# Patient Record
Sex: Male | Born: 1970
Health system: Southern US, Community
[De-identification: ages and names within clinical notes are randomized; demographics above are authoritative.]

## PROBLEM LIST (undated history)

## (undated) DIAGNOSIS — M109 Gout, unspecified: Secondary | ICD-10-CM

## (undated) DIAGNOSIS — E119 Type 2 diabetes mellitus without complications: Secondary | ICD-10-CM

---

## 1998-10-11 ENCOUNTER — Emergency Department (HOSPITAL_COMMUNITY): Admission: EM | Admit: 1998-10-11 | Discharge: 1998-10-11 | Payer: Self-pay | Admitting: Emergency Medicine

## 1999-12-03 ENCOUNTER — Emergency Department (HOSPITAL_COMMUNITY): Admission: EM | Admit: 1999-12-03 | Discharge: 1999-12-03 | Payer: Self-pay | Admitting: Emergency Medicine

## 1999-12-21 ENCOUNTER — Emergency Department (HOSPITAL_COMMUNITY): Admission: EM | Admit: 1999-12-21 | Discharge: 1999-12-21 | Payer: Self-pay | Admitting: Emergency Medicine

## 2001-12-20 ENCOUNTER — Emergency Department (HOSPITAL_COMMUNITY): Admission: EM | Admit: 2001-12-20 | Discharge: 2001-12-20 | Payer: Self-pay | Admitting: *Deleted

## 2005-01-07 ENCOUNTER — Emergency Department (HOSPITAL_COMMUNITY): Admission: EM | Admit: 2005-01-07 | Discharge: 2005-01-07 | Payer: Self-pay | Admitting: Family Medicine

## 2012-03-01 ENCOUNTER — Encounter (HOSPITAL_COMMUNITY): Payer: Self-pay | Admitting: Emergency Medicine

## 2012-03-01 ENCOUNTER — Emergency Department (HOSPITAL_COMMUNITY)
Admission: EM | Admit: 2012-03-01 | Discharge: 2012-03-01 | Disposition: A | Discharge status: Home / Self Care | Payer: Self-pay | Attending: Emergency Medicine | Admitting: Emergency Medicine

## 2012-03-01 DIAGNOSIS — M65849 Other synovitis and tenosynovitis, unspecified hand: Secondary | ICD-10-CM | POA: Insufficient documentation

## 2012-03-01 DIAGNOSIS — M65839 Other synovitis and tenosynovitis, unspecified forearm: Secondary | ICD-10-CM | POA: Insufficient documentation

## 2012-03-01 DIAGNOSIS — M778 Other enthesopathies, not elsewhere classified: Secondary | ICD-10-CM

## 2012-03-01 DIAGNOSIS — M2550 Pain in unspecified joint: Secondary | ICD-10-CM | POA: Insufficient documentation

## 2012-03-01 DIAGNOSIS — F17200 Nicotine dependence, unspecified, uncomplicated: Secondary | ICD-10-CM | POA: Insufficient documentation

## 2012-03-01 MED ORDER — IBUPROFEN 800 MG PO TABS: 800.0000 mg | ORAL_TABLET | Freq: Three times a day (TID) | ORAL | Status: DC

## 2012-03-01 MED ORDER — IBUPROFEN 400 MG PO TABS
800.0000 mg | ORAL_TABLET | Freq: Once | ORAL | Status: AC
  Administered 2012-03-01: 800 mg via ORAL
  Filled 2012-03-01: qty 2

## 2012-03-01 MED ORDER — HYDROCODONE-ACETAMINOPHEN 5-325 MG PO TABS: ORAL_TABLET | ORAL | Status: DC

## 2012-03-01 NOTE — ED Provider Notes (Signed)
History    This chart was scribed for Dillon Wolfe. Dillon Lamas, MD, MD by Dillon Wolfe, ED Scribe. The patient was seen in room TR08C and the patient's care was started at 8:39AM.   CSN: [id number]  Arrival date & time 03/01/12  0846   None     Chief Complaint  Patient presents with  . Wrist Pain    (Consider location/radiation/quality/duration/timing/severity/associated sxs/prior treatment) The history is provided by the patient. No language interpreter was used.   [name] is a 41 y.o. male who presents to the Emergency Department complaining of constant, moderate right wrist pain onset 2 days ago. 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 Pt reports taking Tylenol and Advil without relief. He reports swelling in right hand. He denies injury or trauma to wrist and any other pain currently.    History reviewed. No pertinent past medical history.  History reviewed. No pertinent past surgical history.  History reviewed. No pertinent family history.  History  Substance Use Topics  . Smoking status: Light Tobacco Smoker  . Smokeless tobacco: Not on file  . Alcohol Use: No      Review of Systems  Gastrointestinal: Negative for nausea and vomiting.  Musculoskeletal: Positive for arthralgias.  Skin: Negative for color change, rash and wound.  Neurological: Negative for weakness and numbness.    Allergies  Review of patient's allergies indicates no known allergies.  Home Medications   Current Outpatient Rx  Name  Route  Sig  Dispense  Refill  . ACETAMINOPHEN 500 MG PO TABS   Oral   Take 1,000 mg by mouth every 6 (six) hours as needed. For pain         . IBUPROFEN 200 MG PO TABS   Oral   Take 400 mg by mouth every 6 (six) hours as needed. For pain         . HYDROCODONE-ACETAMINOPHEN 5-325 MG PO TABS      1-2 tablets po q 6 hours prn moderate to severe pain   20 tablet   0   . IBUPROFEN 800 MG PO TABS   Oral  Take 1 tablet (800 mg total) by mouth 3 (three) times daily with meals.   21 tablet   0     BP 146/91  Pulse 79  Temp 97.7 F (36.5 C) (Oral)  Resp 20  SpO2 97%  Physical Exam  Nursing note and vitals reviewed. Constitutional: He is oriented to person, place, and time. He appears well-developed and well-nourished. No distress.  HENT:  Head: Normocephalic and atraumatic.  Eyes: EOM are normal.  Pulmonary/Chest: Effort normal. No respiratory distress.  Musculoskeletal: Normal range of motion. He exhibits tenderness.       Minimal edema of right wrist  Tenderness to extensor of right wrist to mid forearm Pain worse with right extension of wrist  No deformity Good pulses Good cap refill  Nl sensation of right fingers Neg snuffbox tenderness    Neurological: He is alert and oriented to person, place, and time.  Skin: Skin is warm and dry. No rash noted. No pallor.  Psychiatric: He has a normal mood and affect. His behavior is normal.    ED Course  Procedures (including critical care time) DIAGNOSTIC STUDIES: Oxygen Saturation is 97% on room air, normal by my interpretation.    COORDINATION OF CARE: 9:04 AM Discussed ED treatment with pt  9:23 AM Ordered:     .  ibuprofen  800 mg Oral Once       Labs Reviewed - No data to display No results found.   1. Tendonitis of wrist, right       MDM  I personally performed the services described in this documentation, which was scribed in my presence. The recorded information has been reviewed and is accurate.   No direct trauma.  Swelling, pain, tenderness and history is suggestive of extensor wrist muscle strain, partial tear, or overuse injury.  Conservative treatment for now.  Pt is otherwise healthy.  RICE at home.  No work with right wrist for 1 week . Refer to Sports clinic.     Dillon Wolfe. Dillon Lamas, MD 03/01/12 [phone]

## 2012-03-01 NOTE — Discharge Instructions (Signed)
Narcotic and benzodiazepine use may cause drowsiness, slowed breathing or dependence.  Please use with caution and do not drive, operate machinery or watch young children alone while taking them.  Taking combinations of these medications or drinking alcohol will potentiate these effects.    

## 2012-03-01 NOTE — Progress Notes (Signed)
Orthopedic Tech Progress Note Patient Details:  Dillon Wolfe 06-26-1970 [id number]  Ortho Devices Type of Ortho Device: Velcro wrist splint Ortho Device/Splint Location: RIGHT WRIST SPLINT Ortho Device/Splint Interventions: Application   [name] 03/01/2012, 9:38 AM

## 2012-03-01 NOTE — ED Notes (Signed)
Onset Saturday, right wrist pain. No known injury. Unable to work last pm due to the pain.

## 2013-09-05 ENCOUNTER — Emergency Department (INDEPENDENT_AMBULATORY_CARE_PROVIDER_SITE_OTHER): Payer: Self-pay

## 2013-09-05 ENCOUNTER — Emergency Department (INDEPENDENT_AMBULATORY_CARE_PROVIDER_SITE_OTHER)
Admission: EM | Admit: 2013-09-05 | Discharge: 2013-09-05 | Disposition: A | Discharge status: Home / Self Care | Payer: Self-pay | Source: Home / Self Care | Attending: Emergency Medicine | Admitting: Emergency Medicine

## 2013-09-05 ENCOUNTER — Encounter (HOSPITAL_COMMUNITY): Payer: Self-pay | Admitting: Emergency Medicine

## 2013-09-05 DIAGNOSIS — S93409A Sprain of unspecified ligament of unspecified ankle, initial encounter: Secondary | ICD-10-CM

## 2013-09-05 DIAGNOSIS — Y93H2 Activity, gardening and landscaping: Secondary | ICD-10-CM

## 2013-09-05 DIAGNOSIS — X500XXA Overexertion from strenuous movement or load, initial encounter: Secondary | ICD-10-CM

## 2013-09-05 NOTE — Discharge Instructions (Signed)
Acute Ankle Sprain  with Phase I Rehab  An acute ankle sprain is a partial or complete tear in one or more of the ligaments of the ankle due to traumatic injury. The severity of the injury depends on both the the number of ligaments sprained and the grade of sprain. There are 3 grades of sprains.   · A grade 1 sprain is a mild sprain. There is a slight pull without obvious tearing. There is no loss of strength, and the muscle and ligament are the correct length.  · A grade 2 sprain is a moderate sprain. There is tearing of fibers within the substance of the ligament where it connects two bones or two cartilages. The length of the ligament is increased, and there is usually decreased strength.  · A grade 3 sprain is a complete rupture of the ligament and is uncommon.  In addition to the grade of sprain, there are three types of ankle sprains.   Lateral ankle sprains: This is a sprain of one or more of the three ligaments on the outer side (lateral) of the ankle. These are the most common sprains.  Medial ankle sprains: There is one large triangular ligament of the inner side (medial) of the ankle that is susceptible to injury. Medial ankle sprains are less common.  Syndesmosis, "high ankle," sprains: The syndesmosis is the ligament that connects the two bones of the lower leg. Syndesmosis sprains usually only occur with very severe ankle sprains.  SYMPTOMS  · Pain, tenderness, and swelling in the ankle, starting at the side of injury that may progress to the whole ankle and foot with time.  · "Pop" or tearing sensation at the time of injury.  · Bruising that may spread to the heel.  · Impaired ability to walk soon after injury.  CAUSES   · Acute ankle sprains are caused by trauma placed on the ankle that temporarily forces or pries the anklebone (talus) out of its normal socket.  · Stretching or tearing of the ligaments that normally hold the joint in place (usually due to a twisting injury).  RISK INCREASES  WITH:  · Previous ankle sprain.  · Sports in which the foot may land awkwardly (ie. basketball, volleyball, or soccer) or walking or running on uneven or rough surfaces.  · Shoes with inadequate support to prevent sideways motion when stress occurs.  · Poor strength and flexibility.  · Poor balance skills.  · Contact sports.  PREVENTION   · Warm up and stretch properly before activity.  · Maintain physical fitness:  · Ankle and leg flexibility, muscle strength, and endurance.  · Cardiovascular fitness.  · Balance training activities.  · Use proper technique and have a coach correct improper technique.  · Taping, protective strapping, bracing, or high-top tennis shoes may help prevent injury. Initially, tape is best; however, it loses most of its support function within 10 to 15 minutes.  · Wear proper fitted protective shoes (High-top shoes with taping or bracing is more effective than either alone).  · Provide the ankle with support during sports and practice activities for 12 months following injury.  PROGNOSIS   · If treated properly, ankle sprains can be expected to recover completely; however, the length of recovery depends on the degree of injury.  · A grade 1 sprain usually heals enough in 5 to 7 days to allow modified activity and requires an average of 6 weeks to heal completely.  · A grade 2 sprain requires   6 to 10 weeks to heal completely.  · A grade 3 sprain requires 12 to 16 weeks to heal.  · A syndesmosis sprain often takes more than 3 months to heal.  RELATED COMPLICATIONS   · Frequent recurrence of symptoms may result in a chronic problem. Appropriately addressing the problem the first time decreases the frequency of recurrence and optimizes healing time. Severity of the initial sprain does not predict the likelihood of later instability.  · Injury to other structures (bone, cartilage, or tendon).  · A chronically unstable or arthritic ankle joint is a possiblity with repeated  sprains.  TREATMENT  Treatment initially involves the use of ice, medication, and compression bandages to help reduce pain and inflammation. Ankle sprains are usually immobilized in a walking cast or boot to allow for healing. Crutches may be recommended to reduce pressure on the injury. After immobilization, strengthening and stretching exercises may be necessary to regain strength and a full range of motion. Surgery is rarely needed to treat ankle sprains.  MEDICATION   · Nonsteroidal anti-inflammatory medications, such as aspirin and ibuprofen (do not take for the first 3 days after injury or within 7 days before surgery), or other minor pain relievers, such as acetaminophen, are often recommended. Take these as directed by your caregiver. Contact your caregiver immediately if any bleeding, stomach upset, or signs of an allergic reaction occur from these medications.  · Ointments applied to the skin may be helpful.  · Pain relievers may be prescribed as necessary by your caregiver. Do not take prescription pain medication for longer than 4 to 7 days. Use only as directed and only as much as you need.  HEAT AND COLD  · Cold treatment (icing) is used to relieve pain and reduce inflammation for acute and chronic cases. Cold should be applied for 10 to 15 minutes every 2 to 3 hours for inflammation and pain and immediately after any activity that aggravates your symptoms. Use ice packs or an ice massage.  · Heat treatment may be used before performing stretching and strengthening activities prescribed by your caregiver. Use a heat pack or a warm soak.  SEEK IMMEDIATE MEDICAL CARE IF:   · Pain, swelling, or bruising worsens despite treatment.  · You experience pain, numbness, discoloration, or coldness in the foot or toes.  · New, unexplained symptoms develop (drugs used in treatment may produce side effects.)  EXERCISES   PHASE I EXERCISES  RANGE OF MOTION (ROM) AND STRETCHING EXERCISES - Ankle Sprain, Acute Phase I,  Weeks 1 to 2  These exercises may help you when beginning to restore flexibility in your ankle. You will likely work on these exercises for the 1 to 2 weeks after your injury. Once your physician, physical therapist, or athletic trainer sees adequate progress, he or she will advance your exercises. While completing these exercises, remember:   · Restoring tissue flexibility helps normal motion to return to the joints. This allows healthier, less painful movement and activity.  · An effective stretch should be held for at least 30 seconds.  · A stretch should never be painful. You should only feel a gentle lengthening or release in the stretched tissue.  RANGE OF MOTION - Dorsi/Plantar Flexion  · While sitting with your right / left knee straight, draw the top of your foot upwards by flexing your ankle. Then reverse the motion, pointing your toes downward.  · Hold each position for __________ seconds.  · After completing your first set of   exercises, repeat this exercise with your knee bent.  Repeat __________ times. Complete this exercise __________ times per day.   RANGE OF MOTION - Ankle Alphabet  · Imagine your right / left big toe is a pen.  · Keeping your hip and knee still, write out the entire alphabet with your "pen." Make the letters as large as you can without increasing any discomfort.  Repeat __________ times. Complete this exercise __________ times per day.   STRENGTHENING EXERCISES - Ankle Sprain, Acute -Phase I, Weeks 1 to 2  These exercises may help you when beginning to restore strength in your ankle. You will likely work on these exercises for 1 to 2 weeks after your injury. Once your physician, physical therapist, or athletic trainer sees adequate progress, he or she will advance your exercises. While completing these exercises, remember:   · Muscles can gain both the endurance and the strength needed for everyday activities through controlled exercises.  · Complete these exercises as instructed by  your physician, physical therapist, or athletic trainer. Progress the resistance and repetitions only as guided.  · You may experience muscle soreness or fatigue, but the pain or discomfort you are trying to eliminate should never worsen during these exercises. If this pain does worsen, stop and make certain you are following the directions exactly. If the pain is still present after adjustments, discontinue the exercise until you can discuss the trouble with your clinician.  STRENGTH - Dorsiflexors  · Secure a rubber exercise band/tubing to a fixed object (ie. table, pole) and loop the other end around your right / left foot.  · Sit on the floor facing the fixed object. The band/tubing should be slightly tense when your foot is relaxed.  · Slowly draw your foot back toward you using your ankle and toes.  · Hold this position for __________ seconds. Slowly release the tension in the band and return your foot to the starting position.  Repeat __________ times. Complete this exercise __________ times per day.   STRENGTH - Plantar-flexors   · Sit with your right / left leg extended. Holding onto both ends of a rubber exercise band/tubing, loop it around the ball of your foot. Keep a slight tension in the band.  · Slowly push your toes away from you, pointing them downward.  · Hold this position for __________ seconds. Return slowly, controlling the tension in the band/tubing.  Repeat __________ times. Complete this exercise __________ times per day.   STRENGTH - Ankle Eversion  · Secure one end of a rubber exercise band/tubing to a fixed object (table, pole). Loop the other end around your foot just before your toes.  · Place your fists between your knees. This will focus your strengthening at your ankle.  · Drawing the band/tubing across your opposite foot, slowly, pull your little toe out and up. Make sure the band/tubing is positioned to resist the entire motion.  · Hold this position for __________ seconds.  Have  your muscles resist the band/tubing as it slowly pulls your foot back to the starting position.   Repeat __________ times. Complete this exercise __________ times per day.   STRENGTH - Ankle Inversion  · Secure one end of a rubber exercise band/tubing to a fixed object (table, pole). Loop the other end around your foot just before your toes.  · Place your fists between your knees. This will focus your strengthening at your ankle.  · Slowly, pull your big toe up and in, making   sure the band/tubing is positioned to resist the entire motion.  · Hold this position for __________ seconds.  · Have your muscles resist the band/tubing as it slowly pulls your foot back to the starting position.  Repeat __________ times. Complete this exercises __________ times per day.   STRENGTH - Towel Curls  · Sit in a chair positioned on a non-carpeted surface.  · Place your right / left foot on a towel, keeping your heel on the floor.  · Pull the towel toward your heel by only curling your toes. Keep your heel on the floor.  · If instructed by your physician, physical therapist, or athletic trainer, add weight to the end of the towel.  Repeat __________ times. Complete this exercise __________ times per day.  Document Released: 10/23/2004 Document Revised: 06/16/2011 Document Reviewed: 07/06/2008  [employer]® Patient Information ©2014 [employer].

## 2013-09-05 NOTE — ED Provider Notes (Signed)
CSN: [id number]     Arrival date & time 09/05/13  1108 History   First MD Initiated Contact with Patient 09/05/13 1248     Chief Complaint  Patient presents with  . Ankle Pain   (Consider location/radiation/quality/duration/timing/severity/associated sxs/prior Treatment) HPI Comments: 43 year old male presents complaining of ankle pain. He was mowing his grass when she stepped in a hole and rolled his ankle over. This happened Saturday, 2 days ago. The pain is not that bad when he is sitting still, but with weightbearing it is much more severe. It radiates up his leg and down for his foot, and also over to his medial ankle from his lateral ankle. He has been weightbearing but with difficulty. He denies any other injuries. No numbness in the foot, he does have some swelling in the ankle.   History reviewed. No pertinent past medical history. History reviewed. No pertinent past surgical history. No family history on file. History  Substance Use Topics  . Smoking status: Light Tobacco Smoker  . Smokeless tobacco: Not on file  . Alcohol Use: No    Review of Systems  Musculoskeletal:       See history of present illness  All other systems reviewed and are negative.   Allergies  Review of patient's allergies indicates no known allergies.  Home Medications   Prior to Admission medications   Medication Sig Start Date End Date Taking? Authorizing Provider  acetaminophen (TYLENOL) 500 MG tablet Take 1,000 mg by mouth every 6 (six) hours as needed. For pain    Historical Provider, MD  HYDROcodone-acetaminophen (NORCO/VICODIN) 5-325 MG per tablet 1-2 tablets po q 6 hours prn moderate to severe pain 03/01/12   Gavin PoundMichael Y. Ghim, MD  ibuprofen (ADVIL,MOTRIN) 200 MG tablet Take 400 mg by mouth every 6 (six) hours as needed. For pain    Historical Provider, MD  ibuprofen (ADVIL,MOTRIN) 800 MG tablet Take 1 tablet (800 mg total) by mouth 3 (three) times daily with meals. 03/01/12   Gavin PoundMichael Y. Ghim,  MD   BP 136/83  Pulse 76  Temp(Src) 98.3 F (36.8 C) (Oral)  Resp 12  SpO2 96% Physical Exam  Nursing note and vitals reviewed. Constitutional: He is oriented to person, place, and time. He appears well-developed and well-nourished. No distress.  HENT:  Head: Normocephalic.  Cardiovascular:  Pulses:      Dorsalis pedis pulses are 2+ on the right side, and 2+ on the left side.  Pulmonary/Chest: Effort normal. No respiratory distress.  Musculoskeletal:       Right ankle: He exhibits decreased range of motion (Mild, diffuse) and swelling (Lateral ankle). He exhibits no ecchymosis and normal pulse. Tenderness. Lateral malleolus (minimal.) tenderness found. No AITFL, no CF ligament, no posterior TFL and no head of 5th metatarsal tenderness found. Achilles tendon normal.       Right foot: Normal.  Neurological: He is alert and oriented to person, place, and time. Coordination normal.  Skin: Skin is warm and dry. No rash noted. He is not diaphoretic.  Psychiatric: He has a normal mood and affect. Judgment normal.    ED Course  Procedures (including critical care time) Labs Review Labs Reviewed - No data to display  Imaging Review Dg Ankle Complete Right  09/05/2013   CLINICAL DATA:  Pain post trauma  EXAM: RIGHT ANKLE - COMPLETE 3+ VIEW  COMPARISON:  None.  FINDINGS: Frontal, oblique, and lateral views were obtained. There is soft tissue swelling laterally. No fracture or effusion. Ankle mortise appears  intact. There is a small spur arising from the inferior calcaneus.  IMPRESSION: Soft tissue swelling laterally. Small calcaneal spur inferiorly. No fracture. Mortise intact.   Electronically Signed   By: Bretta Bang M.D.   On: 09/05/2013 13:20     MDM   1. Ankle sprain    X-ray is normal. He has an air splint at home, and will start using that. He has ibuprofen, he'll take that for pain. Elevate and ice. Activity as tolerated. Followup with primary care week if not  improving.      Graylon Good, PA-C 09/06/13 1203  Graylon Good, PA-C 09/06/13 [phone]

## 2013-09-05 NOTE — ED Notes (Signed)
Pt c/o right ankle pain onset Saturday Reports he twisted his ankle while mowing yard Sx include intermittent swelling and pain when bearing wt Alert w/no signs of acute distress; assisted by crutches

## 2013-09-06 NOTE — ED Provider Notes (Signed)
Medical screening examination/treatment/procedure(s) were performed by non-physician practitioner and as supervising physician I was immediately available for consultation/collaboration.  Leslee Home, M.D.  Reuben Likes, MD 09/06/13 [phone]

## 2014-07-02 ENCOUNTER — Emergency Department (HOSPITAL_COMMUNITY)
Admission: EM | Admit: 2014-07-02 | Discharge: 2014-07-02 | Disposition: A | Discharge status: Home / Self Care | Payer: Self-pay | Attending: Emergency Medicine | Admitting: Emergency Medicine

## 2014-07-02 ENCOUNTER — Encounter (HOSPITAL_COMMUNITY): Payer: Self-pay | Admitting: *Deleted

## 2014-07-02 DIAGNOSIS — Z720 Tobacco use: Secondary | ICD-10-CM | POA: Insufficient documentation

## 2014-07-02 DIAGNOSIS — F10121 Alcohol abuse with intoxication delirium: Secondary | ICD-10-CM | POA: Insufficient documentation

## 2014-07-02 DIAGNOSIS — Z79899 Other long term (current) drug therapy: Secondary | ICD-10-CM | POA: Insufficient documentation

## 2014-07-02 DIAGNOSIS — F10921 Alcohol use, unspecified with intoxication delirium: Secondary | ICD-10-CM

## 2014-07-02 LAB — ETHANOL: Alcohol, Ethyl (B): 307 mg/dL — ABNORMAL HIGH (ref 0–9)

## 2014-07-02 LAB — RAPID URINE DRUG SCREEN, HOSP PERFORMED
Amphetamines: NOT DETECTED
Barbiturates: NOT DETECTED
Benzodiazepines: NOT DETECTED
Cocaine: NOT DETECTED
Opiates: NOT DETECTED
Tetrahydrocannabinol: NOT DETECTED

## 2014-07-02 MED ORDER — ONDANSETRON HCL 4 MG/2ML IJ SOLN
4.0000 mg | Freq: Once | INTRAMUSCULAR | Status: DC
  Filled 2014-07-02: qty 2

## 2014-07-02 MED ORDER — PANTOPRAZOLE SODIUM 40 MG IV SOLR
40.0000 mg | Freq: Once | INTRAVENOUS | Status: AC
  Administered 2014-07-02: 40 mg via INTRAVENOUS
  Filled 2014-07-02: qty 40

## 2014-07-02 MED ORDER — ONDANSETRON HCL 4 MG/2ML IJ SOLN
4.0000 mg | Freq: Once | INTRAMUSCULAR | Status: AC
  Administered 2014-07-02: 4 mg via INTRAVENOUS

## 2014-07-02 MED ORDER — SODIUM CHLORIDE 0.9 % IV BOLUS (SEPSIS)
1000.0000 mL | Freq: Once | INTRAVENOUS | Status: AC
  Administered 2014-07-02: 1000 mL via INTRAVENOUS

## 2014-07-02 NOTE — ED Notes (Signed)
Bed: RESB Expected date:  Expected time:  Means of arrival:  Comments: EMS-OD 

## 2014-07-02 NOTE — ED Notes (Signed)
Pt refused vitals upon d/c. Pt was provided with a bus pass to get home. Pt A&O x4 and ambulates with steady gait.

## 2014-07-02 NOTE — ED Notes (Signed)
Breakfast tray offered.

## 2014-07-02 NOTE — ED Notes (Addendum)
Pt responds when this RN gently shook shoulder. PT reports he is at the hospital when asked if he was aware of where he was. PT goes back to sleep after answering questions. PT is lying on stomach comfortably. SPO2 92% hear rate 86. No vomitus noted to mouth. Non slip socks placed on patient's feet.

## 2014-07-02 NOTE — ED Notes (Signed)
Patient resting in position of comfort with eyes closed RR WNL--even and unlabored with equal rise and fall of chest Patient in NAD Side rails up, call bell in reach  

## 2014-07-02 NOTE — ED Notes (Signed)
Pt was awakened by [employer] and told to eat. Pt raised to yell stating " I don;t like to be rushed. I'm paying for this." Pt was advised by [employer] that the bed was needed for sick pts and he needs to get up. Pt was also advised that he has been allowed to sleep in hall bed for close to 12 hours. Pt now lying back on bed

## 2014-07-02 NOTE — ED Provider Notes (Signed)
CSN: [id number]     Arrival date & time 07/02/14  0146 History   First MD Initiated Contact with Patient 07/02/14 0159     Chief Complaint  Patient presents with  . Alcohol Intoxication     (Consider location/radiation/quality/duration/timing/severity/associated sxs/prior Treatment) HPI  Level 5 Caveat: altered mental status, intoxicated. This is a 44 year old male who was "drinking all day" with some friends or family in a garage. He was sleeping in a chair in the corner but when he began vomiting on himself EMS was called. He was transiently arousable to noxious stimuli but is otherwise been unresponsive. He was covered with emesis. EMS reports both beer and hard liquor were present. Bystanders report he has also been using marijuana.  History reviewed. No pertinent past medical history. History reviewed. No pertinent past surgical history. History reviewed. No pertinent family history. History  Substance Use Topics  . Smoking status: Light Tobacco Smoker  . Smokeless tobacco: Not on file  . Alcohol Use: Yes    Review of Systems  Unable to perform ROS   Allergies  Review of patient's allergies indicates no known allergies.  Home Medications   Prior to Admission medications   Medication Sig Start Date End Date Taking? Authorizing Provider  acetaminophen (TYLENOL) 500 MG tablet Take 1,000 mg by mouth every 6 (six) hours as needed. For pain    Historical Provider, MD  HYDROcodone-acetaminophen (NORCO/VICODIN) 5-325 MG per tablet 1-2 tablets po q 6 hours prn moderate to severe pain 03/01/12   Quita SkyeMichael Ghim, MD  ibuprofen (ADVIL,MOTRIN) 200 MG tablet Take 400 mg by mouth every 6 (six) hours as needed. For pain    Historical Provider, MD  ibuprofen (ADVIL,MOTRIN) 800 MG tablet Take 1 tablet (800 mg total) by mouth 3 (three) times daily with meals. 03/01/12   Quita SkyeMichael Ghim, MD   BP 144/76 mmHg  Pulse 95  Resp 21  SpO2 98%   Physical Exam  General: Well-developed, well-nourished  male in no acute distress; appearance consistent with age of record HENT: normocephalic; atraumatic Eyes: Pupils equal, round and pinpoint; bilateral conjunctival injection Neck: supple Heart: regular rate and rhythm Lungs: clear to auscultation bilaterally Abdomen: soft; nondistended; no masses or hepatosplenomegaly; bowel sounds present GU: Tanner 5 male, circumcised Extremities: No deformity; full range of motion; pulses normal Neurologic: Obtunded, with localized pain; noted to move all extremities  Skin: Warm and dry   ED Course  Procedures (including critical care time)   MDM  Nursing notes and vitals signs, including pulse oximetry, reviewed.  Summary of this visit's results, reviewed by myself:  Labs:  Results for orders placed or performed during the hospital encounter of 07/02/14 (from the past 24 hour(s))  Drug screen panel, emergency     Status: None   Collection Time: 07/02/14  2:08 AM  Result Value Ref Range   Opiates NONE DETECTED NONE DETECTED   Cocaine NONE DETECTED NONE DETECTED   Benzodiazepines NONE DETECTED NONE DETECTED   Amphetamines NONE DETECTED NONE DETECTED   Tetrahydrocannabinol NONE DETECTED NONE DETECTED   Barbiturates NONE DETECTED NONE DETECTED  Ethanol     Status: Abnormal   Collection Time: 07/02/14  2:58 AM  Result Value Ref Range   Alcohol, Ethyl (B) 307 (H) 0 - 9 mg/dL   7:826:54 AM Patient now arousable. Will discharge when able to ambulate on his own and has a sober ride available.  Paula LibraJohn Tationna Fullard, MD 07/02/14 [phone]

## 2014-07-02 NOTE — ED Notes (Signed)
Patient brought in by EMS due to ETOH intoxication Patient has been drinking and smoking marijuana all day Per EMS, vomit all over patient and beside patient when arriving on scene Patient arouses to pain only--snoring noted

## 2014-07-02 NOTE — ED Notes (Signed)
Pt provided with sandwich and soda per Dr Loretha StaplerWofford. Pt still wants to sleep and has to be encouraged several times to wake up and eat.

## 2014-07-02 NOTE — ED Provider Notes (Signed)
Care assumed from Dr. Read DriversMolpus.  Pt sober now.  He ambulated without assistance or difficulty.  Discharged.  Clinical Impression: 1. Alcohol intoxication, with delirium       Dillon DivineJohn Zinnia Tindall, MD 07/02/14 1251

## 2014-07-02 NOTE — Discharge Instructions (Signed)
Alcohol Intoxication  Alcohol intoxication occurs when the amount of alcohol that a person has consumed impairs his or her ability to mentally and physically function. Alcohol directly impairs the normal chemical activity of the brain. Drinking large amounts of alcohol can lead to changes in mental function and behavior, and it can cause many physical effects that can be harmful.   Alcohol intoxication can range in severity from mild to very severe. Various factors can affect the level of intoxication that occurs, such as the person's age, gender, weight, frequency of alcohol consumption, and the presence of other medical conditions (such as diabetes, seizures, or heart conditions). Dangerous levels of alcohol intoxication may occur when people drink large amounts of alcohol in a short period (binge drinking). Alcohol can also be especially dangerous when combined with certain prescription medicines or "recreational" drugs.  SIGNS AND SYMPTOMS  Some common signs and symptoms of mild alcohol intoxication include:  · Loss of coordination.  · Changes in mood and behavior.  · Impaired judgment.  · Slurred speech.  As alcohol intoxication progresses to more severe levels, other signs and symptoms will appear. These may include:  · Vomiting.  · Confusion and impaired memory.  · Slowed breathing.  · Seizures.  · Loss of consciousness.  DIAGNOSIS   Your health care provider will take a medical history and perform a physical exam. You will be asked about the amount and type of alcohol you have consumed. Blood tests will be done to measure the concentration of alcohol in your blood. In many places, your blood alcohol level must be lower than 80 mg/dL (0.08%) to legally drive. However, many dangerous effects of alcohol can occur at much lower levels.   TREATMENT   People with alcohol intoxication often do not require treatment. Most of the effects of alcohol intoxication are temporary, and they go away as the alcohol naturally  leaves the body. Your health care provider will monitor your condition until you are stable enough to go home. Fluids are sometimes given through an IV access tube to help prevent dehydration.   HOME CARE INSTRUCTIONS  · Do not drive after drinking alcohol.  · Stay hydrated. Drink enough water and fluids to keep your urine clear or pale yellow. Avoid caffeine.    · Only take over-the-counter or prescription medicines as directed by your health care provider.    SEEK MEDICAL CARE IF:   · You have persistent vomiting.    · You do not feel better after a few days.  · You have frequent alcohol intoxication. Your health care provider can help determine if you should see a substance use treatment counselor.  SEEK IMMEDIATE MEDICAL CARE IF:   · You become shaky or tremble when you try to stop drinking.    · You shake uncontrollably (seizure).    · You throw up (vomit) blood. This may be bright red or may look like black coffee grounds.    · You have blood in your stool. This may be bright red or may appear as a black, tarry, bad smelling stool.    · You become lightheaded or faint.    MAKE SURE YOU:   · Understand these instructions.  · Will watch your condition.  · Will get help right away if you are not doing well or get worse.  Document Released: 01/01/2005 Document Revised: 11/24/2012 Document Reviewed: 08/27/2012  [employer]® Patient Information ©2015 [employer]. This information is not intended to replace advice given to you by your health care provider. Make sure   you discuss any questions you have with your health care provider.

## 2014-07-02 NOTE — ED Notes (Signed)
Pt ambulated in hallway with moderate assistance.

## 2016-01-09 ENCOUNTER — Encounter (HOSPITAL_COMMUNITY): Payer: Self-pay | Admitting: Emergency Medicine

## 2016-01-09 ENCOUNTER — Ambulatory Visit (HOSPITAL_COMMUNITY)
Admission: EM | Admit: 2016-01-09 | Discharge: 2016-01-09 | Disposition: A | Discharge status: Home / Self Care | Payer: Self-pay | Attending: Family Medicine | Admitting: Family Medicine

## 2016-01-09 DIAGNOSIS — R220 Localized swelling, mass and lump, head: Secondary | ICD-10-CM

## 2016-01-09 DIAGNOSIS — T7840XA Allergy, unspecified, initial encounter: Secondary | ICD-10-CM

## 2016-01-09 DIAGNOSIS — T783XXA Angioneurotic edema, initial encounter: Secondary | ICD-10-CM

## 2016-01-09 MED ORDER — EPINEPHRINE HCL 1 MG/ML IJ SOLN
INTRAMUSCULAR | Status: AC
  Filled 2016-01-09: qty 1

## 2016-01-09 MED ORDER — DIPHENHYDRAMINE HCL 25 MG PO CAPS: 50.0000 mg | ORAL_CAPSULE | Freq: Once | ORAL | Status: DC

## 2016-01-09 MED ORDER — DIPHENHYDRAMINE HCL 25 MG PO CAPS
ORAL_CAPSULE | ORAL | Status: AC
  Filled 2016-01-09: qty 2

## 2016-01-09 MED ORDER — PREDNISONE 20 MG PO TABS: 20.0000 mg | ORAL_TABLET | Freq: Every day | ORAL | 0 refills | Status: DC

## 2016-01-09 MED ORDER — EPINEPHRINE HCL 1 MG/ML IJ SOLN
0.3000 mg | Freq: Once | INTRAMUSCULAR | Status: AC
  Administered 2016-01-09: 0.3 mg via INTRAMUSCULAR

## 2016-01-09 NOTE — ED Notes (Signed)
Provider stated to hold the oral benadryl.

## 2016-01-09 NOTE — ED Triage Notes (Signed)
[facility] with a complaint of a rash on his arms, neck and back and his upper lip swelling. The patient stated that it started at his employment.

## 2016-01-09 NOTE — ED Provider Notes (Signed)
CSN: [id number]     Arrival date & time 01/09/16  1438 History   None    Chief Complaint  Patient presents with  . Rash  . Oral Swelling   (Consider location/radiation/quality/duration/timing/severity/associated sxs/prior Treatment) Dillon Wolfe is a 45 y.o male, with no medical history and not currently taking any medication, presents today complaining of swollen lips and rash to his upper back and arms. Rash started 2 days ago. The swollen lip started at 2pm today upon wakening from sleep. 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 The second day he also worked and that is when he noticed the rash on his arms and back. He reports the rash comes and goes. He reports the rash to be very itchy but he didn't think much about it on the second day. He went to work last night as well, came home from work this morning and went to bed feeling fine, work up at 2pm this afternoon and noticed both of his lips are swollen and immediately proceeded to take 50 mg of benadryl. He denies shortness of breath, swollen tongue, wheezing or sensation of his throat closing up.  He denies being allergic to anything. He is not taking any medication. He denies eating any new food. He cannot recall any new exposure or contact.       History reviewed. No pertinent past medical history. History reviewed. No pertinent surgical history. History reviewed. No pertinent family history. Social History  Substance Use Topics  . Smoking status: Light Tobacco Smoker  . Smokeless tobacco: Never Used  . Alcohol use Yes    Review of Systems  Constitutional: Negative for chills, fatigue and fever.  Respiratory: Negative for cough, chest tightness, shortness of breath and wheezing.   Cardiovascular: Negative for chest pain and palpitations.  Gastrointestinal: Negative for  abdominal pain, nausea and vomiting.  Skin: Positive for rash.    Allergies  Review of patient's allergies indicates no known allergies.  Home Medications   Prior to Admission medications   Medication Sig Start Date End Date Taking? Authorizing Provider  acetaminophen (TYLENOL) 500 MG tablet Take 1,000 mg by mouth every 6 (six) hours as needed. For pain    Historical Provider, MD  HYDROcodone-acetaminophen (NORCO/VICODIN) 5-325 MG per tablet 1-2 tablets po q 6 hours prn moderate to severe pain 03/01/12   Quita SkyeMichael Ghim, MD  ibuprofen (ADVIL,MOTRIN) 200 MG tablet Take 400 mg by mouth every 6 (six) hours as needed. For pain    Historical Provider, MD  ibuprofen (ADVIL,MOTRIN) 800 MG tablet Take 1 tablet (800 mg total) by mouth 3 (three) times daily with meals. 03/01/12   Quita SkyeMichael Ghim, MD  predniSONE (DELTASONE) 20 MG tablet Take 1 tablet (20 mg total) by mouth daily with breakfast. Take 3 tablets at one time daily from day 1-3, Take 2 tablets daily from day 4-6, take 1 tablet daily from day 7-9 01/09/16   Lucia EstelleFeng Kinslie Hove, NP   Meds Ordered and Administered this Visit   Medications  EPINEPHrine (ADRENALIN) injection 0.3 mg (0.3 mg Intramuscular Given 01/09/16 1555)    BP 100/69 (BP Location: Left Arm)   Pulse 99   Temp 98.6 F (37 C) (Oral)   Resp 12   SpO2 100%  No data found.   Physical Exam  Constitutional: He appears well-developed and well-nourished.  HENT:  Head: Normocephalic and atraumatic.  Both lips are swollen, no tongue swelling noted.   Cardiovascular: Normal rate, regular rhythm and normal heart sounds.   Pulmonary/Chest: Effort normal and breath sounds normal. No respiratory distress. He has no wheezes.  -Stridor  Abdominal: Soft. Bowel sounds are normal. He exhibits no distension. There is no tenderness.  Skin:  Hives noted on right and left upper back. No rash noted on arms.   Nursing note and vitals reviewed.   Urgent Care Course   Clinical Course     Procedures (including critical care time)  Labs Review Labs Reviewed - No data to display  Imaging Review No results found.   MDM   1. Giant hives, initial encounter   2. Swelling of both lips    Patient informed that he most likely got allergic to something causing him to have these symptoms. Patient is in denial; refusing to believe that his swollen lips and rash is due to an allergy. Epi 0.3mg  given IM today. Patient kept for an hour in the UC with no worsening in symptoms noted. Rx for prednisone given for the rash. Respiratory system intact, patient safe to be discharge home. ER return precaution discussed thoroughly and patient states understanding. Also recommended to see an allergist.     Lucia Estelle, NP 01/09/16 1806

## 2016-10-07 ENCOUNTER — Ambulatory Visit (INDEPENDENT_AMBULATORY_CARE_PROVIDER_SITE_OTHER): Payer: BLUE CROSS/BLUE SHIELD

## 2016-10-07 ENCOUNTER — Encounter (HOSPITAL_COMMUNITY): Payer: Self-pay | Admitting: Family Medicine

## 2016-10-07 ENCOUNTER — Ambulatory Visit (HOSPITAL_COMMUNITY)
Admission: EM | Admit: 2016-10-07 | Discharge: 2016-10-07 | Disposition: A | Discharge status: Home / Self Care | Payer: BLUE CROSS/BLUE SHIELD | Attending: Internal Medicine | Admitting: Internal Medicine

## 2016-10-07 DIAGNOSIS — S93401A Sprain of unspecified ligament of right ankle, initial encounter: Secondary | ICD-10-CM

## 2016-10-07 MED ORDER — IBUPROFEN 600 MG PO TABS: 600.0000 mg | ORAL_TABLET | Freq: Four times a day (QID) | ORAL | 0 refills | Status: DC | PRN

## 2016-10-07 NOTE — Discharge Instructions (Signed)
Wear the ankle splint for the next 4-5 days. Weightbearing as tolerated. For the next 2 or 3 days primarily elevate the ankle but ice on it rest it,. No exercises on the ankle for at least 2 weeks. It may feel better before then but it needs that time to strengthen completely up before running, jumping or doing exercises.

## 2016-10-07 NOTE — ED Provider Notes (Signed)
CSN: [id number]     Arrival date & time 10/07/16  1005 History   First MD Initiated Contact with Patient 10/07/16 1052     Chief Complaint  Patient presents with  . Ankle Pain   (Consider location/radiation/quality/duration/timing/severity/associated sxs/prior Treatment) 46 year old male states that he was performing step aerobics yesterday where he would have to step up and then down off of a block. Somehow he missed the step and fell twisting his right ankle. Is complaining of pain that developed immediately and has persisted through today. Pain is primarily around the ankle with some pain to the plantar aspect of the heel.      History reviewed. No pertinent past medical history. History reviewed. No pertinent surgical history. History reviewed. No pertinent family history. Social History  Substance Use Topics  . Smoking status: Light Tobacco Smoker  . Smokeless tobacco: Never Used  . Alcohol use Yes    Review of Systems  Constitutional: Positive for activity change.  Respiratory: Negative.   Gastrointestinal: Negative.   Genitourinary: Negative.   Musculoskeletal: Negative for back pain and myalgias.       As per HPI  Skin: Negative.   Neurological: Negative for dizziness, weakness, numbness and headaches.  All other systems reviewed and are negative.   Allergies  Patient has no known allergies.  Home Medications   Prior to Admission medications   Medication Sig Start Date End Date Taking? Authorizing Provider  acetaminophen (TYLENOL) 500 MG tablet Take 1,000 mg by mouth every 6 (six) hours as needed. For pain    [provider]  HYDROcodone-acetaminophen (NORCO/VICODIN) 5-325 MG per tablet 1-2 tablets po q 6 hours prn moderate to severe pain 03/01/12   Quita Skye, MD  ibuprofen (ADVIL,MOTRIN) 600 MG tablet Take 1 tablet (600 mg total) by mouth every 6 (six) hours as needed. Take with food 10/07/16   Hayden Rasmussen, NP  predniSONE (DELTASONE) 20 MG tablet Take  1 tablet (20 mg total) by mouth daily with breakfast. Take 3 tablets at one time daily from day 1-3, Take 2 tablets daily from day 4-6, take 1 tablet daily from day 7-9 01/09/16   Lucia Estelle, NP   Meds Ordered and Administered this Visit  Medications - No data to display  BP (!) 138/94   Pulse 78   Temp 98.1 F (36.7 C)   Resp 18   SpO2 99%  No data found.   Physical Exam  Constitutional: He is oriented to person, place, and time. He appears well-developed and well-nourished. No distress.  HENT:  Head: Normocephalic and atraumatic.  Eyes: EOM are normal. Left eye exhibits no discharge.  Neck: Normal range of motion. Neck supple.  Cardiovascular: Normal rate.   Pulmonary/Chest: Effort normal.  Musculoskeletal: He exhibits tenderness. He exhibits no deformity.  Minor swelling to the right ankle. Tenderness surrounding the ankle and to the plantar aspect near the arch and heel. No deformity. Pedal pulse 2+. Able to perform limited range of motion secondary to pain. No bony tenderness appreciated. Neurovascular motor sensory grossly intact.  Neurological: He is alert and oriented to person, place, and time. No cranial nerve deficit.  Skin: Skin is warm and dry.  Psychiatric: He has a normal mood and affect.  Nursing note and vitals reviewed.   Urgent Care Course     Procedures (including critical care time)  Labs Review Labs Reviewed - No data to display  Imaging Review Dg Ankle Complete Right  Result Date: 10/07/2016 CLINICAL DATA:  Injured  ankle doing aerobics yesterday EXAM: RIGHT ANKLE - COMPLETE 3+ VIEW COMPARISON:  Right ankle films of 09/05/2013 FINDINGS: There are small bony densities just anterior to tibiotalar articulation on lateral view which do not appear to be acute and may represent old avulsion fragments. There is mild degenerative change as well with some spurring from the medial malleolus. The ankle joint is unremarkable and alignment is normal. A tiny plantar  calcaneal degenerative spur is present. IMPRESSION: 1. No definite acute fracture. 2. Small bony fragments anterior to the tibiotalar articulation most consistent with old small avulsion fracture fragments. 3. Mild degenerative change. Electronically Signed   By: Dwyane DeePaul  Barry M.D.   On: 10/07/2016 11:19     Visual Acuity Review  Right Eye Distance:   Left Eye Distance:   Bilateral Distance:    Right Eye Near:   Left Eye Near:    Bilateral Near:         MDM   1. Sprain of right ankle, unspecified ligament, initial encounter    Wear the ankle splint for the next 4-5 days. Weightbearing as tolerated. For the next 2 or 3 days primarily elevate the ankle but ice on it rest it,. No exercises on the ankle for at least 2 weeks. It may feel better before then but it needs that time to strengthen completely up before running, jumping or doing exercises.     Hayden RasmussenMabe, Michelle Wnek, NP 10/07/16 1146

## 2016-10-07 NOTE — ED Triage Notes (Signed)
Pt here for right foot and ankle pain. sts that he injured it at the gym a few days ago. sts he was doing some sort of step class and stepped up and then fell

## 2016-10-11 NOTE — ED Notes (Signed)
Pt   Returned   Charity fundraiserWanting  Clarification    Of   Work  Note    Chart  Given to  Tenet HealthcareDavid  mabe   Who  Spoke  To  The  Pt

## 2016-10-15 ENCOUNTER — Ambulatory Visit (HOSPITAL_COMMUNITY)
Admission: EM | Admit: 2016-10-15 | Discharge: 2016-10-15 | Disposition: A | Discharge status: Home / Self Care | Payer: BLUE CROSS/BLUE SHIELD

## 2016-10-15 DIAGNOSIS — Z5321 Procedure and treatment not carried out due to patient leaving prior to being seen by health care provider: Secondary | ICD-10-CM

## 2017-09-24 ENCOUNTER — Encounter (HOSPITAL_COMMUNITY): Payer: Self-pay | Admitting: *Deleted

## 2017-09-24 ENCOUNTER — Emergency Department (HOSPITAL_COMMUNITY)
Admission: EM | Admit: 2017-09-24 | Discharge: 2017-09-24 | Disposition: A | Discharge status: Home / Self Care | Payer: BLUE CROSS/BLUE SHIELD | Attending: Emergency Medicine | Admitting: Emergency Medicine

## 2017-09-24 ENCOUNTER — Other Ambulatory Visit: Payer: Self-pay

## 2017-09-24 DIAGNOSIS — S66911A Strain of unspecified muscle, fascia and tendon at wrist and hand level, right hand, initial encounter: Secondary | ICD-10-CM | POA: Diagnosis not present

## 2017-09-24 DIAGNOSIS — Y929 Unspecified place or not applicable: Secondary | ICD-10-CM | POA: Insufficient documentation

## 2017-09-24 DIAGNOSIS — Z79899 Other long term (current) drug therapy: Secondary | ICD-10-CM | POA: Diagnosis not present

## 2017-09-24 DIAGNOSIS — Y999 Unspecified external cause status: Secondary | ICD-10-CM | POA: Diagnosis not present

## 2017-09-24 DIAGNOSIS — Y939 Activity, unspecified: Secondary | ICD-10-CM | POA: Diagnosis not present

## 2017-09-24 DIAGNOSIS — F17210 Nicotine dependence, cigarettes, uncomplicated: Secondary | ICD-10-CM | POA: Diagnosis not present

## 2017-09-24 DIAGNOSIS — X58XXXA Exposure to other specified factors, initial encounter: Secondary | ICD-10-CM | POA: Insufficient documentation

## 2017-09-24 MED ORDER — IBUPROFEN 800 MG PO TABS: 800.0000 mg | ORAL_TABLET | Freq: Three times a day (TID) | ORAL | 0 refills | Status: DC

## 2017-09-24 NOTE — Discharge Instructions (Signed)
Take 4 over the counter ibuprofen tablets 3 times a day or 2 over-the-counter naproxen tablets twice a day for pain. Also take tylenol 1000mg(2 extra strength) four times a day.    

## 2017-09-24 NOTE — ED Provider Notes (Signed)
[facility]   CSN: [id number] Arrival date & time: 09/24/17  0148     History   Chief Complaint Chief Complaint  Patient presents with  . Hand Pain    HPI Dillon Wolfe is a 47 y.o. male.  47 yo M with a history of right hand pain.  This been going on for many months.  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  About the third digit between the MTP and the thenar processes.  Denies trauma.  States that it usually worse while he is at work and improves with rest at home.  Has taken Tylenol and ibuprofen for this.  States he takes about 5 extra strength Tylenol tablets at a time and has taken up to 5 Motrin tablets.  He feels that he needs more tablets because of his size.  The history is provided by the patient.  Hand Pain  This is a new problem. The current episode started more than 1 week ago. The problem occurs constantly. The problem has been gradually worsening. Pertinent negatives include no chest pain, no abdominal pain, no headaches and no shortness of breath. The symptoms are aggravated by bending and twisting. The symptoms are relieved by rest, NSAIDs and acetaminophen. He has tried acetaminophen and rest for the symptoms. The treatment provided significant relief.    History reviewed. No pertinent past medical history.  There are no active problems to display for this patient.   History reviewed. No pertinent surgical history.      Home Medications    Prior to Admission medications   Medication Sig Start Date End Date Taking? Authorizing Provider  acetaminophen (TYLENOL) 500 MG tablet Take 1,000 mg by mouth every 6 (six) hours as needed. For pain    [provider]  HYDROcodone-acetaminophen (NORCO/VICODIN) 5-325 MG per tablet 1-2 tablets po q 6 hours prn moderate to severe pain 03/01/12   Quita SkyeGhim, Michael, MD  ibuprofen (ADVIL,MOTRIN)  800 MG tablet Take 1 tablet (800 mg total) by mouth 3 (three) times daily. 09/24/17   Melene PlanFloyd, Aveion Nguyen, DO  predniSONE (DELTASONE) 20 MG tablet Take 1 tablet (20 mg total) by mouth daily with breakfast. Take 3 tablets at one time daily from day 1-3, Take 2 tablets daily from day 4-6, take 1 tablet daily from day 7-9 01/09/16   Lucia EstelleZheng, Feng, NP    Family History No family history on file.  Social History Social History   Tobacco Use  . Smoking status: Light Tobacco Smoker  . Smokeless tobacco: Never Used  Substance Use Topics  . Alcohol use: Yes  . Drug use: No     Allergies   Patient has no known allergies.   Review of Systems Review of Systems  Constitutional: Negative for chills and fever.  HENT: Negative for congestion and facial swelling.   Eyes: Negative for discharge and visual disturbance.  Respiratory: Negative for shortness of breath.   Cardiovascular: Negative for chest pain and palpitations.  Gastrointestinal: Negative for abdominal pain, diarrhea and vomiting.  Musculoskeletal: Positive for arthralgias. Negative for myalgias.  Skin: Negative for color change and rash.  Neurological: Negative for tremors, syncope and headaches.  Psychiatric/Behavioral: Negative for confusion and dysphoric mood.     Physical Exam Updated Vital Signs BP (!) 141/119 (BP Location: Left Arm)   Pulse 88   Temp 98.5 F (36.9 C) (Oral)   Resp  20   SpO2 98%   Physical Exam  Constitutional: He is oriented to person, place, and time. He appears well-developed and well-nourished.  HENT:  Head: Normocephalic and atraumatic.  Eyes: Pupils are equal, round, and reactive to light. EOM are normal.  Neck: Normal range of motion. Neck supple. No JVD present.  Cardiovascular: Normal rate and regular rhythm. Exam reveals no gallop and no friction rub.  No murmur heard. Pulmonary/Chest: No respiratory distress. He has no wheezes.  Abdominal: He exhibits no distension. There is no rebound and no  guarding.  Musculoskeletal: Normal range of motion. He exhibits tenderness. He exhibits no edema.  Tender about the mid hand.  No noted edema or erythema.  Full range of motion.  Intact radial pulses.  Neurological: He is alert and oriented to person, place, and time.  Skin: No rash noted. No pallor.  Psychiatric: He has a normal mood and affect. His behavior is normal.  Nursing note and vitals reviewed.    ED Treatments / Results  Labs (all labs ordered are listed, but only abnormal results are displayed) Labs Reviewed - No data to display  EKG None  Radiology No results found.  Procedures Procedures (including critical care time)  Medications Ordered in ED Medications - No data to display   Initial Impression / Assessment and Plan / ED Course  I have reviewed the triage vital signs and the nursing notes.  Pertinent labs & imaging results that were available during my care of the patient were reviewed by me and considered in my medical decision making (see chart for details).     47 yo M with a chief complaint of right hand pain.  By history this sounds like an overuse injury.  We will have the patient attempt to rest and take NSAIDs and Tylenol.  He has been taking a larger than recommended dose and I counseled him on this.  I also recommended that he tablets care with a primary care provider.  I discussed with him that he was hypertensive today at his visits and this needs to be follow-up closely as an outpatient.  2:37 AM:  I have discussed the diagnosis/risks/treatment options with the patient and believe the pt to be eligible for discharge home to follow-up with PCP. We also discussed returning to the ED immediately if new or worsening sx occur. We discussed the sx which are most concerning (e.g., sudden worsening pain, fever, inability to tolerate by mouth) that necessitate immediate return. Medications administered to the patient during their visit and any new  prescriptions provided to the patient are listed below.  Medications given during this visit Medications - No data to display    The patient appears reasonably screen and/or stabilized for discharge and I doubt any other medical condition or other [facility] requiring further screening, evaluation, or treatment in the ED at this time prior to discharge.    Final Clinical Impressions(s) / ED Diagnoses   Final diagnoses:  Hand strain, right, initial encounter    ED Discharge Orders        Ordered    ibuprofen (ADVIL,MOTRIN) 800 MG tablet  3 times daily     09/24/17 0224       Melene Plan, DO 09/24/17 [phone]

## 2017-09-24 NOTE — ED Triage Notes (Signed)
Pt has been having R hand swelling for an unknown amount of time. 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 No visible swelling noted at present

## 2018-10-06 ENCOUNTER — Ambulatory Visit (HOSPITAL_COMMUNITY)
Admission: EM | Admit: 2018-10-06 | Discharge: 2018-10-06 | Disposition: A | Discharge status: Home / Self Care | Payer: BLUE CROSS/BLUE SHIELD

## 2018-10-06 ENCOUNTER — Encounter (HOSPITAL_COMMUNITY): Payer: Self-pay

## 2018-10-06 ENCOUNTER — Other Ambulatory Visit: Payer: Self-pay

## 2018-10-06 DIAGNOSIS — S63501A Unspecified sprain of right wrist, initial encounter: Secondary | ICD-10-CM

## 2018-10-06 NOTE — ED Triage Notes (Signed)
Patient presents to Urgent Care with complaints of right wrist pain since working on his car 3 days ago. Patient reports he has been using ibuprofen and ice for the swelling. Pt told by work that he needs a note to return to work stating what his lifting restrictions are.

## 2018-10-06 NOTE — Discharge Instructions (Signed)
Use anti-inflammatories for pain/swelling. You may take up to 800 mg Ibuprofen every 8 hours with food. You may supplement Ibuprofen with Tylenol [phone] mg every 8 hours.   Ice   Wear bandage

## 2018-10-07 NOTE — ED Provider Notes (Signed)
[place]    CSN: [id number] Arrival date & time: 10/06/18  1552      History   Chief Complaint Chief Complaint  Patient presents with  . Wrist Pain    HPI Dillon Wolfe is a 48 y.o. male no significant past medical history presenting today for evaluation of wrist pain and note for work.  Patient states Sunday he was working on a car and was twisting his hand awkwardly.  Later that evening he developed pain and swelling in his right wrist.  Since he has been taking ibuprofen as well as icing and wearing an Ace wrap.  Symptoms have slightly improved, but he does still have limitations with work as he does a lot of heavy lifting and twisting of his wrists.  He is not concerned about fracture, leaves this is a sprain and wishes to continue treating it how he is currently treating it.  He is requesting a note for temporary restrictions to allow this to fully heal.  He denies any numbness or tingling.  Denies any fall.  HPI  History reviewed. No pertinent past medical history.  There are no active problems to display for this patient.   History reviewed. No pertinent surgical history.     Home Medications    Prior to Admission medications   Medication Sig Start Date End Date Taking? Authorizing Provider  acetaminophen (TYLENOL) 500 MG tablet Take 1,000 mg by mouth every 6 (six) hours as needed. For pain    [provider]  HYDROcodone-acetaminophen (NORCO/VICODIN) 5-325 MG per tablet 1-2 tablets po q 6 hours prn moderate to severe pain 03/01/12   Kingsley Spittle, MD  ibuprofen (ADVIL,MOTRIN) 800 MG tablet Take 1 tablet (800 mg total) by mouth 3 (three) times daily. 09/24/17   Deno Etienne, DO  predniSONE (DELTASONE) 20 MG tablet Take 1 tablet (20 mg total) by mouth daily with breakfast. Take 3 tablets at one time daily from day 1-3, Take 2 tablets daily from day 4-6, take 1 tablet daily from day 7-9 01/09/16   Barry Dienes, NP    Family History Family History   Problem Relation Age of Onset  . Obesity Mother   . Heart failure Mother   . Hypertension Mother   . Cancer Father     Social History Social History   Tobacco Use  . Smoking status: Light Tobacco Smoker    Types: Cigars  . Smokeless tobacco: Never Used  . Tobacco comment: black and milds when drinking  Substance Use Topics  . Alcohol use: Yes  . Drug use: No     Allergies   Patient has no known allergies.   Review of Systems Review of Systems  Constitutional: Negative for fatigue and fever.  Eyes: Negative for redness, itching and visual disturbance.  Respiratory: Negative for shortness of breath.   Cardiovascular: Negative for chest pain and leg swelling.  Gastrointestinal: Negative for nausea and vomiting.  Musculoskeletal: Positive for arthralgias and joint swelling. Negative for myalgias.  Skin: Negative for color change, rash and wound.  Neurological: Negative for dizziness, syncope, weakness, light-headedness and headaches.     Physical Exam Triage Vital Signs ED Triage Vitals  Enc Vitals Group     BP 10/06/18 1639 (!) 146/79     Pulse Rate 10/06/18 1639 83     Resp 10/06/18 1639 16     Temp 10/06/18 1639 98.5 F (36.9 C)     Temp Source 10/06/18 1639 Oral     SpO2  10/06/18 1639 100 %     Weight --      Height --      Head Circumference --      Peak Flow --      Pain Score 10/06/18 1636 3     Pain Loc --      Pain Edu? --      Excl. in GC? --    No data found.  Updated Vital Signs BP (!) 146/79 (BP Location: Right Arm)   Pulse 83   Temp 98.5 F (36.9 C) (Oral)   Resp 16   SpO2 100%   Visual Acuity Right Eye Distance:   Left Eye Distance:   Bilateral Distance:    Right Eye Near:   Left Eye Near:    Bilateral Near:     Physical Exam Vitals signs and nursing note reviewed.  Constitutional:      Appearance: He is well-developed.     Comments: No acute distress  HENT:     Head: Normocephalic and atraumatic.     Nose: Nose normal.   Eyes:     Conjunctiva/sclera: Conjunctivae normal.  Neck:     Musculoskeletal: Neck supple.  Cardiovascular:     Rate and Rhythm: Normal rate.  Pulmonary:     Effort: Pulmonary effort is normal. No respiratory distress.  Abdominal:     General: There is no distension.  Musculoskeletal: Normal range of motion.     Comments: Right wrist: Does appear mildly swollen compared to left, mild tenderness to palpation distal radius and ulna, no snuffbox tenderness, slightly limited flexion and extension of wrist.  Full active range of motion of fingers Radial pulse 2+ Sensation intact distally  Skin:    General: Skin is warm and dry.  Neurological:     Mental Status: He is alert and oriented to person, place, and time.      UC Treatments / Results  Labs (all labs ordered are listed, but only abnormal results are displayed) Labs Reviewed - No data to display  EKG   Radiology No results found.  Procedures Procedures (including critical care time)  Medications Ordered in UC Medications - No data to display  Initial Impression / Assessment and Plan / UC Course  I have reviewed the triage vital signs and the nursing notes.  Pertinent labs & imaging results that were available during my care of the patient were reviewed by me and considered in my medical decision making (see chart for details).     No significant mechanism of injury, most likely sprain rest.  Recommend continued conservative anti-inflammatories ice, Ace wrap.  Did offer more supportive wrist brace, patient declined.  Provided work note to avoid heavy lifting for 1 week.Discussed strict return precautions. Patient verbalized understanding and is agreeable with plan.  Final Clinical Impressions(s) / UC Diagnoses   Final diagnoses:  Sprain of right wrist, initial encounter     Discharge Instructions     Use anti-inflammatories for pain/swelling. You may take up to 800 mg Ibuprofen every 8 hours with food. You  may supplement Ibuprofen with Tylenol [phone] mg every 8 hours.   Ice   Wear bandage   ED Prescriptions    None     Controlled Substance Prescriptions [place] Controlled Substance Registry consulted? Not Applicable   Lew DawesWieters, Kaira Stringfield C, [place]-C 10/07/18 [phone]

## 2018-10-14 ENCOUNTER — Other Ambulatory Visit: Payer: Self-pay

## 2018-10-14 ENCOUNTER — Encounter (HOSPITAL_COMMUNITY): Payer: Self-pay

## 2018-10-14 ENCOUNTER — Ambulatory Visit (HOSPITAL_COMMUNITY)
Admission: EM | Admit: 2018-10-14 | Discharge: 2018-10-14 | Disposition: A | Discharge status: Home / Self Care | Payer: Self-pay | Attending: Emergency Medicine | Admitting: Emergency Medicine

## 2018-10-14 DIAGNOSIS — S63501D Unspecified sprain of right wrist, subsequent encounter: Secondary | ICD-10-CM

## 2018-10-14 NOTE — Discharge Instructions (Signed)
Wear wrist brace at work for additional support and compression. May continue ibuprofen/Tylenol as needed for pain. Return if you develop worsening pain, swelling, or unable to call things in your right hand.

## 2018-10-14 NOTE — ED Provider Notes (Signed)
[place]    CSN: [id number] Arrival date & time: 10/14/18  0801     History   Chief Complaint Chief Complaint  Patient presents with   Work Note    HPI Dillon Wolfe is a 48 y.o. male follow-up of right wrist pain.  Patient was evaluated on 7/1, HPI as below: "Dillon Wolfe is a 48 y.o. male no significant past medical history presenting today for evaluation of wrist pain and note for work.  Patient states Sunday he was working on a car and was twisting his hand awkwardly.  Later that evening he developed pain and swelling in his right wrist.  Since he has been taking ibuprofen as well as icing and wearing an Ace wrap.  Symptoms have slightly improved, but he does still have limitations with work as he does a lot of heavy lifting and twisting of his wrists.  He is not concerned about fracture, leaves this is a sprain and wishes to continue treating it how he is currently treating it.  He is requesting a note for temporary restrictions to allow this to fully heal.  He denies any numbness or tingling.  Denies any fall."  States since last appointment, he has noticed improvement in swelling, pain.  He tried going back to work yesterday, though his employer wanted a note clearing him to work without restrictions.  Patient works in [employer], Systems analyst.  States that he typically will have to lift "maybe 10 pounds at most ".  Feels that he is able to do this.    History reviewed. No pertinent past medical history.  There are no active problems to display for this patient.   History reviewed. No pertinent surgical history.     Home Medications    Prior to Admission medications   Medication Sig Start Date End Date Taking? Authorizing Provider  acetaminophen (TYLENOL) 500 MG tablet Take 1,000 mg by mouth every 6 (six) hours as needed. For pain    [provider]  HYDROcodone-acetaminophen (NORCO/VICODIN) 5-325 MG per tablet 1-2 tablets po q  6 hours prn moderate to severe pain 03/01/12   Kingsley Spittle, MD  ibuprofen (ADVIL,MOTRIN) 800 MG tablet Take 1 tablet (800 mg total) by mouth 3 (three) times daily. 09/24/17   Deno Etienne, DO    Family History Family History  Problem Relation Age of Onset   Obesity Mother    Heart failure Mother    Hypertension Mother    Cancer Father     Social History Social History   Tobacco Use   Smoking status: Light Tobacco Smoker    Types: Cigars   Smokeless tobacco: Never Used   Tobacco comment: black and milds when drinking  Substance Use Topics   Alcohol use: Yes   Drug use: No     Allergies   Patient has no known allergies.   Review of Systems Review of Systems  Constitutional: Negative for fever and malaise/fatigue.  Respiratory: Negative for cough and shortness of breath.   Cardiovascular: Negative for chest pain and palpitations.  Gastrointestinal: Negative for abdominal pain, diarrhea and vomiting.  Musculoskeletal: Negative for joint pain and myalgias.       Positive for right wrist stiffness     Physical Exam Triage Vital Signs ED Triage Vitals [10/14/18 0818]  Enc Vitals Group     BP (!) 145/102     Pulse Rate 76     Resp 18     Temp 98.1 F (36.7  C)     Temp Source Oral     SpO2 98 %     Weight      Height      Head Circumference      Peak Flow      Pain Score      Pain Loc      Pain Edu?      Excl. in GC?    No data found.  Updated Vital Signs BP (!) 145/102 (BP Location: Right Arm)    Pulse 76    Temp 98.1 F (36.7 C) (Oral)    Resp 18    SpO2 98%   Visual Acuity Right Eye Distance:   Left Eye Distance:   Bilateral Distance:    Right Eye Near:   Left Eye Near:    Bilateral Near:     Physical Exam Constitutional:      General: He is not in acute distress. HENT:     Head: Normocephalic and atraumatic.  Eyes:     General: No scleral icterus.    Pupils: Pupils are equal, round, and reactive to light.  Cardiovascular:      Rate and Rhythm: Normal rate.  Pulmonary:     Effort: Pulmonary effort is normal.  Musculoskeletal:     Comments: Right wrist without swelling, ecchymosis, erythema, warmth, bony deformity.  No bony tenderness, negative snuffbox tenderness.  Neurovascularly intact.  Slightly limited range of motion of wrist.  Skin:    Coloration: Skin is not jaundiced or pale.  Neurological:     Mental Status: He is alert and oriented to person, place, and time.      UC Treatments / Results  Labs (all labs ordered are listed, but only abnormal results are displayed) Labs Reviewed - No data to display  EKG   Radiology No results found.  Procedures Procedures (including critical care time)  Medications Ordered in UC Medications - No data to display  Initial Impression / Assessment and Plan / UC Course  I have reviewed the triage vital signs and the nursing notes.  Pertinent labs & imaging results that were available during my care of the patient were reviewed by me and considered in my medical decision making (see chart for details).     48 year old male presenting for follow-up of right wrist strain.  Patient feels he is doing much better, though is agreeable to wrist brace so that he may return to work without restrictions.  Work note provided.  Return precautions discussed, patient verbalized understanding and is agreeable to plan. Final Clinical Impressions(s) / UC Diagnoses   Final diagnoses:  Right wrist sprain, subsequent encounter     Discharge Instructions     Wear wrist brace at work for additional support and compression. May continue ibuprofen/Tylenol as needed for pain. Return if you develop worsening pain, swelling, or unable to call things in your right hand.    ED Prescriptions    None     Controlled Substance Prescriptions [place] Controlled Substance Registry consulted? Not Applicable   Odette FractionHall-Potvin, BonsallBrittany, [place]-C 10/14/18 [phone]

## 2018-10-14 NOTE — ED Triage Notes (Signed)
Pt presents for work note to get clearance back to work without restrictions for right wrist pain/swelling from last visit on 10/06/2018.

## 2019-12-21 ENCOUNTER — Encounter (HOSPITAL_COMMUNITY): Payer: Self-pay | Admitting: *Deleted

## 2019-12-21 ENCOUNTER — Emergency Department (HOSPITAL_COMMUNITY): Payer: Medicaid Other

## 2019-12-21 ENCOUNTER — Inpatient Hospital Stay (HOSPITAL_COMMUNITY)
Admission: EM | Admit: 2019-12-21 | Discharge: 2019-12-30 | DRG: 371 | Disposition: A | Discharge status: Home Health | Payer: Medicaid Other | Attending: Surgery | Admitting: Surgery

## 2019-12-21 ENCOUNTER — Other Ambulatory Visit: Payer: Self-pay

## 2019-12-21 DIAGNOSIS — B966 Bacteroides fragilis [B. fragilis] as the cause of diseases classified elsewhere: Secondary | ICD-10-CM | POA: Diagnosis not present

## 2019-12-21 DIAGNOSIS — E871 Hypo-osmolality and hyponatremia: Secondary | ICD-10-CM | POA: Diagnosis not present

## 2019-12-21 DIAGNOSIS — R631 Polydipsia: Secondary | ICD-10-CM | POA: Diagnosis present

## 2019-12-21 DIAGNOSIS — E8809 Other disorders of plasma-protein metabolism, not elsewhere classified: Secondary | ICD-10-CM | POA: Diagnosis present

## 2019-12-21 DIAGNOSIS — K3533 Acute appendicitis with perforation and localized peritonitis, with abscess: Secondary | ICD-10-CM | POA: Diagnosis present

## 2019-12-21 DIAGNOSIS — K381 Appendicular concretions: Secondary | ICD-10-CM | POA: Diagnosis present

## 2019-12-21 DIAGNOSIS — K3532 Acute appendicitis with perforation and localized peritonitis, without abscess: Secondary | ICD-10-CM | POA: Diagnosis not present

## 2019-12-21 DIAGNOSIS — E119 Type 2 diabetes mellitus without complications: Secondary | ICD-10-CM | POA: Diagnosis not present

## 2019-12-21 DIAGNOSIS — Z8249 Family history of ischemic heart disease and other diseases of the circulatory system: Secondary | ICD-10-CM

## 2019-12-21 DIAGNOSIS — T508X5A Adverse effect of diagnostic agents, initial encounter: Secondary | ICD-10-CM | POA: Diagnosis not present

## 2019-12-21 DIAGNOSIS — Z1611 Resistance to penicillins: Secondary | ICD-10-CM | POA: Diagnosis present

## 2019-12-21 DIAGNOSIS — A414 Sepsis due to anaerobes: Secondary | ICD-10-CM | POA: Diagnosis not present

## 2019-12-21 DIAGNOSIS — X58XXXA Exposure to other specified factors, initial encounter: Secondary | ICD-10-CM | POA: Diagnosis present

## 2019-12-21 DIAGNOSIS — N179 Acute kidney failure, unspecified: Secondary | ICD-10-CM | POA: Diagnosis not present

## 2019-12-21 DIAGNOSIS — E860 Dehydration: Secondary | ICD-10-CM | POA: Diagnosis present

## 2019-12-21 DIAGNOSIS — D649 Anemia, unspecified: Secondary | ICD-10-CM | POA: Diagnosis present

## 2019-12-21 DIAGNOSIS — E1165 Type 2 diabetes mellitus with hyperglycemia: Secondary | ICD-10-CM | POA: Diagnosis not present

## 2019-12-21 DIAGNOSIS — F17290 Nicotine dependence, other tobacco product, uncomplicated: Secondary | ICD-10-CM | POA: Diagnosis present

## 2019-12-21 DIAGNOSIS — K750 Abscess of liver: Secondary | ICD-10-CM | POA: Diagnosis present

## 2019-12-21 DIAGNOSIS — Z20822 Contact with and (suspected) exposure to covid-19: Secondary | ICD-10-CM | POA: Diagnosis present

## 2019-12-21 DIAGNOSIS — Z9181 History of falling: Secondary | ICD-10-CM | POA: Diagnosis not present

## 2019-12-21 DIAGNOSIS — N170 Acute kidney failure with tubular necrosis: Secondary | ICD-10-CM | POA: Diagnosis not present

## 2019-12-21 DIAGNOSIS — G4700 Insomnia, unspecified: Secondary | ICD-10-CM | POA: Diagnosis present

## 2019-12-21 DIAGNOSIS — A498 Other bacterial infections of unspecified site: Secondary | ICD-10-CM | POA: Diagnosis not present

## 2019-12-21 LAB — COMPREHENSIVE METABOLIC PANEL
ALT: 75 U/L — ABNORMAL HIGH (ref 0–44)
AST: 79 U/L — ABNORMAL HIGH (ref 15–41)
Albumin: 2.5 g/dL — ABNORMAL LOW (ref 3.5–5.0)
Alkaline Phosphatase: 111 U/L (ref 38–126)
Anion gap: 15 (ref 5–15)
BUN: 24 mg/dL — ABNORMAL HIGH (ref 6–20)
CO2: 21 mmol/L — ABNORMAL LOW (ref 22–32)
Calcium: 8.8 mg/dL — ABNORMAL LOW (ref 8.9–10.3)
Chloride: 95 mmol/L — ABNORMAL LOW (ref 98–111)
Creatinine, Ser: 1.71 mg/dL — ABNORMAL HIGH (ref 0.61–1.24)
GFR calc Af Amer: 53 mL/min — ABNORMAL LOW (ref >60)
GFR calc non Af Amer: 46 mL/min — ABNORMAL LOW (ref >60)
Glucose, Bld: 179 mg/dL — ABNORMAL HIGH (ref 70–99)
Potassium: 4.6 mmol/L (ref 3.5–5.1)
Sodium: 131 mmol/L — ABNORMAL LOW (ref 135–145)
Total Bilirubin: 2.2 mg/dL — ABNORMAL HIGH (ref 0.3–1.2)
Total Protein: 8.6 g/dL — ABNORMAL HIGH (ref 6.5–8.1)

## 2019-12-21 LAB — URINALYSIS, ROUTINE W REFLEX MICROSCOPIC
Bilirubin Urine: NEGATIVE
Glucose, UA: NEGATIVE mg/dL
Ketones, ur: NEGATIVE mg/dL
Leukocytes,Ua: NEGATIVE
Nitrite: NEGATIVE
Protein, ur: 30 mg/dL — AB
Specific Gravity, Urine: 1.015 (ref 1.005–1.030)
pH: 5 (ref 5.0–8.0)

## 2019-12-21 LAB — CBC
HCT: 32.7 % — ABNORMAL LOW (ref 39.0–52.0)
Hemoglobin: 11.1 g/dL — ABNORMAL LOW (ref 13.0–17.0)
MCH: 25.5 pg — ABNORMAL LOW (ref 26.0–34.0)
MCHC: 33.9 g/dL (ref 30.0–36.0)
MCV: 75 fL — ABNORMAL LOW (ref 80.0–100.0)
Platelets: 289 10*3/uL (ref 150–400)
RBC: 4.36 MIL/uL (ref 4.22–5.81)
RDW: 14.3 % (ref 11.5–15.5)
WBC: 22.2 10*3/uL — ABNORMAL HIGH (ref 4.0–10.5)
nRBC: 0 % (ref 0.0–0.2)

## 2019-12-21 LAB — LACTIC ACID, PLASMA
Lactic Acid, Venous: 1.7 mmol/L (ref 0.5–1.9)
Lactic Acid, Venous: 1.6 mmol/L (ref 0.5–1.9)

## 2019-12-21 LAB — APTT: aPTT: 45 seconds — ABNORMAL HIGH (ref 24–36)

## 2019-12-21 LAB — CBG MONITORING, ED: Glucose-Capillary: 195 mg/dL — ABNORMAL HIGH (ref 70–99)

## 2019-12-21 LAB — SARS CORONAVIRUS 2 BY RT PCR (HOSPITAL ORDER, PERFORMED IN ~~LOC~~ HOSPITAL LAB): SARS Coronavirus 2: NEGATIVE

## 2019-12-21 LAB — PROTIME-INR
INR: 1.3 — ABNORMAL HIGH (ref 0.8–1.2)
Prothrombin Time: 15.3 seconds — ABNORMAL HIGH (ref 11.4–15.2)

## 2019-12-21 MED ORDER — ONDANSETRON 4 MG PO TBDP: 4.0000 mg | ORAL_TABLET | Freq: Four times a day (QID) | ORAL | Status: DC | PRN

## 2019-12-21 MED ORDER — KCL IN DEXTROSE-NACL 20-5-0.45 MEQ/L-%-% IV SOLN
INTRAVENOUS | Status: DC
  Filled 2019-12-21 (×10): qty 1000

## 2019-12-21 MED ORDER — ACETAMINOPHEN 500 MG PO TABS: 1000.0000 mg | ORAL_TABLET | Freq: Four times a day (QID) | ORAL | Status: DC | PRN

## 2019-12-21 MED ORDER — SIMETHICONE 80 MG PO CHEW: 40.0000 mg | CHEWABLE_TABLET | Freq: Four times a day (QID) | ORAL | Status: DC | PRN

## 2019-12-21 MED ORDER — MORPHINE SULFATE (PF) 2 MG/ML IV SOLN: 2.0000 mg | INTRAVENOUS | Status: DC | PRN

## 2019-12-21 MED ORDER — IOHEXOL 300 MG/ML  SOLN
100.0000 mL | Freq: Once | INTRAMUSCULAR | Status: AC | PRN
  Administered 2019-12-21: 100 mL via INTRAVENOUS

## 2019-12-21 MED ORDER — DIPHENHYDRAMINE HCL 50 MG/ML IJ SOLN: 25.0000 mg | Freq: Four times a day (QID) | INTRAMUSCULAR | Status: DC | PRN

## 2019-12-21 MED ORDER — PIPERACILLIN-TAZOBACTAM 3.375 G IVPB 30 MIN
3.3750 g | Freq: Once | INTRAVENOUS | Status: AC
  Administered 2019-12-21: 3.375 g via INTRAVENOUS
  Filled 2019-12-21: qty 50

## 2019-12-21 MED ORDER — ONDANSETRON HCL 4 MG/2ML IJ SOLN: 4.0000 mg | Freq: Four times a day (QID) | INTRAMUSCULAR | Status: DC | PRN

## 2019-12-21 MED ORDER — DIPHENHYDRAMINE HCL 25 MG PO CAPS: 25.0000 mg | ORAL_CAPSULE | Freq: Four times a day (QID) | ORAL | Status: DC | PRN

## 2019-12-21 MED ORDER — PIPERACILLIN-TAZOBACTAM 3.375 G IVPB
3.3750 g | Freq: Three times a day (TID) | INTRAVENOUS | Status: DC
  Administered 2019-12-21 – 2019-12-23 (×7): 3.375 g via INTRAVENOUS
  Filled 2019-12-21 (×8): qty 50

## 2019-12-21 MED ORDER — LACTATED RINGERS IV BOLUS (SEPSIS)
2000.0000 mL | Freq: Once | INTRAVENOUS | Status: AC
  Administered 2019-12-21: 2000 mL via INTRAVENOUS

## 2019-12-21 MED ORDER — ACETAMINOPHEN 325 MG PO TABS
650.0000 mg | ORAL_TABLET | Freq: Once | ORAL | Status: AC
  Administered 2019-12-21: 650 mg via ORAL
  Filled 2019-12-21: qty 2

## 2019-12-21 MED ORDER — HYDROCODONE-ACETAMINOPHEN 5-325 MG PO TABS: 1.0000 | ORAL_TABLET | Freq: Four times a day (QID) | ORAL | Status: DC | PRN

## 2019-12-21 MED ORDER — DOCUSATE SODIUM 100 MG PO CAPS
100.0000 mg | ORAL_CAPSULE | Freq: Two times a day (BID) | ORAL | Status: DC
  Administered 2019-12-23 – 2019-12-30 (×10): 100 mg via ORAL
  Filled 2019-12-21 (×16): qty 1

## 2019-12-21 MED ORDER — ENOXAPARIN SODIUM 40 MG/0.4ML ~~LOC~~ SOLN
40.0000 mg | SUBCUTANEOUS | Status: DC
  Administered 2019-12-21: 40 mg via SUBCUTANEOUS
  Filled 2019-12-21: qty 0.4

## 2019-12-21 NOTE — ED Provider Notes (Signed)
[facility]-EMERGENCY DEPT Provider Note   CSN: [id number] Arrival date & time: 12/21/19  1006     History Chief Complaint  Patient presents with  . Chills  . Insomnia  . Excessive Sweating    Dillon Wolfe is a 49 y.o. male.  The history is provided by the patient and medical records. No language interpreter was used.  Insomnia  Dillon Wolfe is a 49 y.o. male who presents to the Emergency Department complaining of chills, fatigue. He presents the emergency department for multiple complaints. He states that one week ago he fell and had been lying around at home due to pain in the right side of his back. His back pain is now resolved but he has been experiencing persistent diaphoresis and excessive thirst presents for evaluation of this. Symptoms of diaphoresis and excessive thirst have been ongoing for about two weeks. He reports 30 pounds weight loss over the last two weeks. He has been experiencing intermittent right-sided abdominal pain, none currently. He does have nausea. He has vomited twice in the last two weeks. Denies diarrhea, dysuria. He has not been vaccinated for COVID-19. No known sick contacts.  Has no known medical problems, takes no medications.      History reviewed. No pertinent past medical history.  There are no problems to display for this patient.   History reviewed. No pertinent surgical history.     Family History  Problem Relation Age of Onset  . Obesity Mother   . Heart failure Mother   . Hypertension Mother   . Cancer Father     Social History   Tobacco Use  . Smoking status: Light Tobacco Smoker    Types: Cigars  . Smokeless tobacco: Never Used  . Tobacco comment: black and milds when drinking  Vaping Use  . Vaping Use: Never used  Substance Use Topics  . Alcohol use: Not Currently  . Drug use: No    Home Medications Prior to Admission medications   Medication Sig Start Date End Date Taking?  Authorizing Provider  acetaminophen (TYLENOL) 500 MG tablet Take 1,000 mg by mouth every 6 (six) hours as needed. For pain    [provider]  HYDROcodone-acetaminophen (NORCO/VICODIN) 5-325 MG per tablet 1-2 tablets po q 6 hours prn moderate to severe pain 03/01/12   Quita Skye, MD  ibuprofen (ADVIL,MOTRIN) 800 MG tablet Take 1 tablet (800 mg total) by mouth 3 (three) times daily. 09/24/17   Melene Plan, DO    Allergies    Patient has no known allergies.  Review of Systems   Review of Systems  Psychiatric/Behavioral: The patient has insomnia.   All other systems reviewed and are negative.   Physical Exam Updated Vital Signs BP 108/84 (BP Location: Right Arm)   Pulse 98   Temp 98.7 F (37.1 C) (Oral)   Resp 20   Ht 5\' 8"  (1.727 m)   Wt 104.3 kg   SpO2 96%   BMI 34.97 kg/m   Physical Exam Vitals and nursing note reviewed.  Constitutional:      Appearance: He is well-developed.  HENT:     Head: Normocephalic and atraumatic.  Cardiovascular:     Rate and Rhythm: Regular rhythm. Tachycardia present.     Heart sounds: No murmur heard.   Pulmonary:     Effort: Pulmonary effort is normal. No respiratory distress.     Breath sounds: Normal breath sounds.  Abdominal:     Palpations: Abdomen is soft.  Tenderness: There is no abdominal tenderness. There is no guarding or rebound.  Musculoskeletal:        General: No swelling or tenderness.  Skin:    General: Skin is warm and dry.  Neurological:     Mental Status: He is alert and oriented to person, place, and time.  Psychiatric:        Behavior: Behavior normal.     ED Results / Procedures / Treatments   Labs (all labs ordered are listed, but only abnormal results are displayed) Labs Reviewed  CBC - Abnormal; Notable for the following components:      Result Value   WBC 22.2 (*)    Hemoglobin 11.1 (*)    HCT 32.7 (*)    MCV 75.0 (*)    MCH 25.5 (*)    All other components within normal limits    COMPREHENSIVE METABOLIC PANEL - Abnormal; Notable for the following components:   Sodium 131 (*)    Chloride 95 (*)    CO2 21 (*)    Glucose, Bld 179 (*)    BUN 24 (*)    Creatinine, Ser 1.71 (*)    Calcium 8.8 (*)    Total Protein 8.6 (*)    Albumin 2.5 (*)    AST 79 (*)    ALT 75 (*)    Total Bilirubin 2.2 (*)    GFR calc non Af Amer 46 (*)    GFR calc Af Amer 53 (*)    All other components within normal limits  PROTIME-INR - Abnormal; Notable for the following components:   Prothrombin Time 15.3 (*)    INR 1.3 (*)    All other components within normal limits  APTT - Abnormal; Notable for the following components:   aPTT 45 (*)    All other components within normal limits  URINALYSIS, ROUTINE W REFLEX MICROSCOPIC - Abnormal; Notable for the following components:   Color, Urine AMBER (*)    APPearance HAZY (*)    Hgb urine dipstick SMALL (*)    Protein, ur 30 (*)    Bacteria, UA FEW (*)    All other components within normal limits  CBG MONITORING, ED - Abnormal; Notable for the following components:   Glucose-Capillary 195 (*)    All other components within normal limits  URINE CULTURE  CULTURE, BLOOD (ROUTINE X 2)  CULTURE, BLOOD (ROUTINE X 2)  SARS CORONAVIRUS 2 BY RT PCR (HOSPITAL ORDER, PERFORMED IN [facility])  LACTIC ACID, PLASMA  LACTIC ACID, PLASMA    EKG EKG Interpretation  Date/Time:  Wednesday December 21 2019 14:18:46 EDT Ventricular Rate:  109 PR Interval:    QRS Duration: 96 QT Interval:  325 QTC Calculation: 438 R Axis:   94 Text Interpretation: Sinus tachycardia Borderline right axis deviation Low voltage, extremity leads ST elev, probable normal early repol pattern Baseline wander in lead(s) III aVL V4 V5 Confirmed by Tilden Fossa [phone]) on 12/21/2019 2:23:44 PM   Radiology DG Chest Port 1 View  Result Date: 12/21/2019 CLINICAL DATA:  Sepsis EXAM: PORTABLE CHEST 1 VIEW COMPARISON:  None. FINDINGS: There is elevation of the  right hemidiaphragm. The lungs are clear. No pneumothorax or pleural effusion. Cardiac size within normal limits. Pulmonary vascularity is normal. No acute bone abnormality. IMPRESSION: Elevation of the right hemidiaphragm.  No active disease. Electronically Signed   By: Helyn Numbers MD   On: 12/21/2019 15:06    Procedures Procedures (including critical care time)  Medications  Ordered in ED Medications  iohexol (OMNIPAQUE) 300 MG/ML solution 100 mL (has no administration in time range)  lactated ringers bolus 2,000 mL (2,000 mLs Intravenous New Bag/Given 12/21/19 1439)  piperacillin-tazobactam (ZOSYN) IVPB 3.375 g (3.375 g Intravenous New Bag/Given 12/21/19 1457)    ED Course  I have reviewed the triage vital signs and the nursing notes.  Pertinent labs & imaging results that were available during my care of the patient were reviewed by me and considered in my medical decision making (see chart for details).    MDM Rules/Calculators/A&P                         Patient here for evaluation of flank pain, diaphoresis, weight loss. He is non-toxic appearing on evaluation but does have tachycardia and borderline blood pressures. CBC with leukocytosis. CMP with mild elevation his transaminases and renal function. He was treated with IV fluids and IV antibiotics for possible infection. Plan to obtain CT abdomen pelvis to rule out intra-abdominal abscess. Patient care transferred pending imaging.  Final Clinical Impression(s) / ED Diagnoses Final diagnoses:  None    Rx / DC Orders ED Discharge Orders    None       Tilden Fossa, MD 12/21/19 1600

## 2019-12-21 NOTE — Plan of Care (Signed)
POC initiated 

## 2019-12-21 NOTE — H&P (Signed)
CC: abd pain, nausea, weight loss  HPI: Dillon Wolfe is a 49 y.o. male who presents to the Emergency Department complaining of chills and fatigue.  He states that 2 weeks ago he fell and had been lying around at home due to pain in the right side of his back. His back pain is now resolved but he has been experiencing persistent diaphoresis and excessive thirst.  He reports 30 pounds weight loss over the last two weeks.  He also has nausea and has vomited twice in the last two weeks. Denies diarrhea, dysuria.  No known sick contacts.  No travel  History reviewed. No pertinent past medical history.  History reviewed. No pertinent surgical history.  Family History  Problem Relation Age of Onset  . Obesity Mother   . Heart failure Mother   . Hypertension Mother   . Cancer Father     Social:  reports that he has been smoking cigars. He has never used smokeless tobacco. He reports previous alcohol use. He reports that he does not use drugs.  Allergies: No Known Allergies  Medications: I have reviewed the patient's current medications.  Results for orders placed or performed during the hospital encounter of 12/21/19 (from the past 48 hour(s))  CBG monitoring, ED     Status: Abnormal   Collection Time: 12/21/19 10:59 AM  Result Value Ref Range   Glucose-Capillary 195 (H) 70 - 99 mg/dL    Comment: Glucose reference range applies only to samples taken after fasting for at least 8 hours.  CBC     Status: Abnormal   Collection Time: 12/21/19  1:00 PM  Result Value Ref Range   WBC 22.2 (H) 4.0 - 10.5 K/uL   RBC 4.36 4.22 - 5.81 MIL/uL   Hemoglobin 11.1 (L) 13.0 - 17.0 g/dL   HCT 81.1 (L) 39 - 52 %   MCV 75.0 (L) 80.0 - 100.0 fL   MCH 25.5 (L) 26.0 - 34.0 pg   MCHC 33.9 30.0 - 36.0 g/dL   RDW 91.4 78.2 - 95.6 %   Platelets 289 150 - 400 K/uL   nRBC 0.0 0.0 - 0.2 %    Comment: Performed at [facility], [address] [place], [place] [address]  Comprehensive  metabolic panel     Status: Abnormal   Collection Time: 12/21/19  1:00 PM  Result Value Ref Range   Sodium 131 (L) 135 - 145 mmol/L   Potassium 4.6 3.5 - 5.1 mmol/L   Chloride 95 (L) 98 - 111 mmol/L   CO2 21 (L) 22 - 32 mmol/L   Glucose, Bld 179 (H) 70 - 99 mg/dL    Comment: Glucose reference range applies only to samples taken after fasting for at least 8 hours.   BUN 24 (H) 6 - 20 mg/dL   Creatinine, Ser 6.57 (H) 0.61 - 1.24 mg/dL   Calcium 8.8 (L) 8.9 - 10.3 mg/dL   Total Protein 8.6 (H) 6.5 - 8.1 g/dL   Albumin 2.5 (L) 3.5 - 5.0 g/dL   AST 79 (H) 15 - 41 U/L   ALT 75 (H) 0 - 44 U/L   Alkaline Phosphatase 111 38 - 126 U/L   Total Bilirubin 2.2 (H) 0.3 - 1.2 mg/dL   GFR calc non Af Amer 46 (L) >60 mL/min   GFR calc Af Amer 53 (L) >60 mL/min   Anion gap 15 5 - 15    Comment: Performed at [facility], [address], [place], [place] [address]  Lactic acid, plasma     Status: None   Collection Time: 12/21/19  2:20 PM  Result Value Ref Range   Lactic Acid, Venous 1.7 0.5 - 1.9 mmol/L    Comment: Performed at [facility], [address], [place], [place] [address]  Protime-INR     Status: Abnormal   Collection Time: 12/21/19  2:20 PM  Result Value Ref Range   Prothrombin Time 15.3 (H) 11.4 - 15.2 seconds   INR 1.3 (H) 0.8 - 1.2    Comment: (NOTE) INR goal varies based on device and disease states. Performed at [facility], [address] [place], [place] [address]   APTT     Status: Abnormal   Collection Time: 12/21/19  2:20 PM  Result Value Ref Range   aPTT 45 (H) 24 - 36 seconds    Comment:        IF BASELINE aPTT IS ELEVATED, SUGGEST PATIENT RISK ASSESSMENT BE USED TO DETERMINE APPROPRIATE ANTICOAGULANT THERAPY. Performed at [facility], [address], [place], [place] [address]   Urinalysis, Routine w reflex microscopic Urine, Clean Catch     Status: Abnormal   Collection Time:  12/21/19  2:20 PM  Result Value Ref Range   Color, Urine AMBER (A) YELLOW    Comment: BIOCHEMICALS MAY BE AFFECTED BY COLOR   APPearance HAZY (A) CLEAR   Specific Gravity, Urine 1.015 1.005 - 1.030   pH 5.0 5.0 - 8.0   Glucose, UA NEGATIVE NEGATIVE mg/dL   Hgb urine dipstick SMALL (A) NEGATIVE   Bilirubin Urine NEGATIVE NEGATIVE   Ketones, ur NEGATIVE NEGATIVE mg/dL   Protein, ur 30 (A) NEGATIVE mg/dL   Nitrite NEGATIVE NEGATIVE   Leukocytes,Ua NEGATIVE NEGATIVE   RBC / HPF 0-5 0 - 5 RBC/hpf   WBC, UA 0-5 0 - 5 WBC/hpf   Bacteria, UA FEW (A) NONE SEEN   Mucus PRESENT    Hyaline Casts, UA PRESENT    Granular Casts, UA PRESENT     Comment: Performed at [facility] [address], [place], [place] [address]  SARS Coronavirus 2 by RT PCR (hospital order, performed in [facility] hospital lab) Nasopharyngeal Nasopharyngeal Swab     Status: None   Collection Time: 12/21/19  2:20 PM   Specimen: Nasopharyngeal Swab  Result Value Ref Range   SARS Coronavirus 2 NEGATIVE NEGATIVE    Comment: (NOTE) SARS-CoV-2 target nucleic acids are NOT DETECTED.  The SARS-CoV-2 RNA is generally detectable in upper and lower respiratory specimens during the acute phase of infection. The lowest concentration of SARS-CoV-2 viral copies this assay can detect is 250 copies / mL. A negative result does not preclude SARS-CoV-2 infection and should not be used as the sole basis for treatment or other patient management decisions.  A negative result may occur with improper specimen collection / handling, submission of specimen other than nasopharyngeal swab, presence of viral mutation(s) within the areas targeted by this assay, and inadequate number of viral copies (<250 copies / mL). A negative result must be combined with clinical observations, patient history, and epidemiological information.  Fact Sheet for Patients:   BoilerBrush.com.cy  Fact Sheet for  Healthcare Providers: https://pope.com/  This test is not yet approved or  cleared by the [place] FDA and has been authorized for detection and/or diagnosis of SARS-CoV-2 by FDA under an Emergency Use Authorization (EUA).  This EUA will remain in effect (  meaning this test can be used) for the duration of the COVID-19 declaration under Section 564(b)(1) of the Act, 21 U.S.C. section 360bbb-3(b)(1), unless the authorization is terminated or revoked sooner.  Performed at [facility], [address] [place], [place] [address]     CT Abdomen Pelvis W Contrast  Result Date: 12/21/2019 CLINICAL DATA:  Abdominal pain and fever, possibly diabetic EXAM: CT ABDOMEN AND PELVIS WITH CONTRAST TECHNIQUE: Multidetector CT imaging of the abdomen and pelvis was performed using the standard protocol following bolus administration of intravenous contrast. CONTRAST:  OMNIPAQUE IOHEXOL 300 MG/ML  SOLN COMPARISON:  None FINDINGS: Lower chest: Atelectatic changes are present in the lung bases. Underlying consolidation not fully excluded. Borderline enlarged 12 mm lower mediastinal lymph node (2/19) likely reactive. Normal heart size. Trace pericardial effusion. Hepatobiliary: There is heterogeneous air and fluid containing rim enhancing collection/abscess extending across the tip of the right lobe liver with evidence of direct intrahepatic extension (4/104). Conglomerate size of the extrahepatic component is approximately 16.7 x 10.5 x 7.5 cm although multiloculated collections within the liver parenchyma measure up to 7.1 x 5.5 x 8.4 cm. The gallbladder is largely decompressed with mural thickening and pericholecystic fluid but without wall discontinuity to suggest gallbladder perforation. No biliary ductal dilatation. Pancreas: Unremarkable. No pancreatic ductal dilatation or surrounding inflammatory changes. Spleen: Normal in size. No concerning splenic lesions.  Adrenals/Urinary Tract: Normal adrenal glands. Kidneys enhance uniformly and symmetrically with normal excretion. No concerning renal mass, urolithiasis or hydronephrosis. Urinary bladder is unremarkable aside from some mild indentation of the bladder base by an enlarged prostate. Stomach/Bowel: Distal esophagus, stomach and duodenum are unremarkable. Minimal small bowel thickening in the right upper quadrant is likely reactive. A thick-walled, mucosally appendix courses from the tip of the cecum, terminating along the inferior extent of the large heterogeneous collection in the right upper quadrant and along the inferior liver tip, as detailed above. Within this collection is a 1 cm calculus layering dependently (2/38). There is additional circumferential mural thickening along the ascending and hepatic flexure. More distal colon has a normal appearance. Vascular/Lymphatic: A reactive adenopathy seen in the right upper quadrant. No pathologically enlarged nodes seen in the abdomen or pelvis. No significant vascular findings. Hepatic and portal veins remain patent as imaged. Reproductive: Mild indentation of bladder base by a borderline enlarged prostate. No concerning prostate lesions. Other: Heterogeneous air and fluid containing rim enhancing collection in the right upper quadrant with intrahepatic extension, as detailed above. This collection is intimate both with the superior margin of the hepatic flexure as well as the tip of the appendix and contains a small calcific radiodensity which could reflect a fecalith. Extensive surrounding phlegmon is noted. No free intraperitoneal air is seen however. No bowel containing hernias Musculoskeletal: No acute osseous abnormality or suspicious osseous lesion. Minimal degenerative changes in the spine. Sclerotic lucent lesion in the left femoral neck, likely synovial pit. IMPRESSION: 1. Large heterogeneous air and fluid containing rim enhancing collection/abscess  extending across the tip of the right lobe liver with evidence of direct intrahepatic extension. This collection is intimate both with the superior margin of the thickened hepatic flexure as well as the tip of a mucosally hyperenhancing appendix, contains a small calcific radiodensity which could reflect a fecalith. This may represent the sequela of a perforated appendicitis or diverticulitis. Extensive surrounding phlegmon is noted as well as likely reactive thickening and inflammatory changes of the gallbladder. 2. No free intraperitoneal air is seen elsewhere. 3.  No adjacent traumatic findings of the abdominal wall or liver parenchyma. 4. Atelectatic changes in the lung bases. 5. Trace pericardial effusion. 6. Aortic Atherosclerosis (ICD10-I70.0). These results were called by telephone at the time of interpretation on 12/21/2019 at 4:48 pm to provider Dr. Rubin Payor, who verbally acknowledged these results. Electronically Signed   By: Kreg Shropshire M.D.   On: 12/21/2019 16:51   DG Chest Port 1 View  Result Date: 12/21/2019 CLINICAL DATA:  Sepsis EXAM: PORTABLE CHEST 1 VIEW COMPARISON:  None. FINDINGS: There is elevation of the right hemidiaphragm. The lungs are clear. No pneumothorax or pleural effusion. Cardiac size within normal limits. Pulmonary vascularity is normal. No acute bone abnormality. IMPRESSION: Elevation of the right hemidiaphragm.  No active disease. Electronically Signed   By: Helyn Numbers MD   On: 12/21/2019 15:06    ROS - all of the below systems have been reviewed with the patient and positives are indicated with bold text General: chills, fever or night sweats Eyes: blurry vision or double vision ENT: epistaxis or sore throat Allergy/Immunology: itchy/watery eyes or nasal congestion Hematologic/Lymphatic: bleeding problems, blood clots or swollen lymph nodes Endocrine: temperature intolerance or unexpected weight changes Breast: new or changing breast lumps or nipple  discharge Resp: cough, shortness of breath, or wheezing CV: chest pain or dyspnea on exertion GI: as per HPI GU: dysuria, trouble voiding, or hematuria MSK: joint pain or joint stiffness Neuro: TIA or stroke symptoms Derm: pruritus and skin lesion changes Psych: anxiety and depression  PE Blood pressure 107/87, pulse 86, temperature (!) 101.2 F (38.4 C), temperature source Oral, resp. rate 20, height 5\' 8"  (1.727 m), weight 104.3 kg, SpO2 94 %. Constitutional: NAD; conversant; no deformities Eyes: Moist conjunctiva; no lid lag; anicteric; PERRL Neck: Trachea midline; no thyromegaly Lungs: Normal respiratory effort; no tactile fremitus CV: RRR; no palpable thrills; no pitting edema GI: Abd soft, TTP RUQ; no palpable hepatosplenomegaly MSK: Normal range of motion of extremities; no clubbing/cyanosis Psychiatric: Appropriate affect; alert and oriented x3 Lymphatic: No palpable cervical or axillary lymphadenopathy  Results for orders placed or performed during the hospital encounter of 12/21/19 (from the past 48 hour(s))  CBG monitoring, ED     Status: Abnormal   Collection Time: 12/21/19 10:59 AM  Result Value Ref Range   Glucose-Capillary 195 (H) 70 - 99 mg/dL    Comment: Glucose reference range applies only to samples taken after fasting for at least 8 hours.  CBC     Status: Abnormal   Collection Time: 12/21/19  1:00 PM  Result Value Ref Range   WBC 22.2 (H) 4.0 - 10.5 K/uL   RBC 4.36 4.22 - 5.81 MIL/uL   Hemoglobin 11.1 (L) 13.0 - 17.0 g/dL   HCT 12/23/19 (L) 39 - 52 %   MCV 75.0 (L) 80.0 - 100.0 fL   MCH 25.5 (L) 26.0 - 34.0 pg   MCHC 33.9 30.0 - 36.0 g/dL   RDW 15.4 00.8 - 67.6 %   Platelets 289 150 - 400 K/uL   nRBC 0.0 0.0 - 0.2 %    Comment: Performed at [facility], [address] [place], [place]  Comprehensive metabolic panel     Status: Abnormal   Collection Time: 12/21/19  1:00 PM  Result Value Ref Range   Sodium 131 (L) 135 - 145  mmol/L   Potassium 4.6 3.5 - 5.1 mmol/L   Chloride 95 (L) 98 - 111 mmol/L   CO2 21 (L) 22 -  32 mmol/L   Glucose, Bld 179 (H) 70 - 99 mg/dL    Comment: Glucose reference range applies only to samples taken after fasting for at least 8 hours.   BUN 24 (H) 6 - 20 mg/dL   Creatinine, Ser 1.61 (H) 0.61 - 1.24 mg/dL   Calcium 8.8 (L) 8.9 - 10.3 mg/dL   Total Protein 8.6 (H) 6.5 - 8.1 g/dL   Albumin 2.5 (L) 3.5 - 5.0 g/dL   AST 79 (H) 15 - 41 U/L   ALT 75 (H) 0 - 44 U/L   Alkaline Phosphatase 111 38 - 126 U/L   Total Bilirubin 2.2 (H) 0.3 - 1.2 mg/dL   GFR calc non Af Amer 46 (L) >60 mL/min   GFR calc Af Amer 53 (L) >60 mL/min   Anion gap 15 5 - 15    Comment: Performed at [facility], [address], [place], [place] [address]  Lactic acid, plasma     Status: None   Collection Time: 12/21/19  2:20 PM  Result Value Ref Range   Lactic Acid, Venous 1.7 0.5 - 1.9 mmol/L    Comment: Performed at [facility], [address], [place], [place] [address]  Protime-INR     Status: Abnormal   Collection Time: 12/21/19  2:20 PM  Result Value Ref Range   Prothrombin Time 15.3 (H) 11.4 - 15.2 seconds   INR 1.3 (H) 0.8 - 1.2    Comment: (NOTE) INR goal varies based on device and disease states. Performed at [facility], [address], [place], [place] [address]   APTT     Status: Abnormal   Collection Time: 12/21/19  2:20 PM  Result Value Ref Range   aPTT 45 (H) 24 - 36 seconds    Comment:        IF BASELINE aPTT IS ELEVATED, SUGGEST PATIENT RISK ASSESSMENT BE USED TO DETERMINE APPROPRIATE ANTICOAGULANT THERAPY. Performed at [facility], [address] [place], [place] [address]   Urinalysis, Routine w reflex microscopic Urine, Clean Catch     Status: Abnormal   Collection Time: 12/21/19  2:20 PM  Result Value Ref Range   Color, Urine AMBER (A) YELLOW    Comment: BIOCHEMICALS MAY BE AFFECTED BY COLOR    APPearance HAZY (A) CLEAR   Specific Gravity, Urine 1.015 1.005 - 1.030   pH 5.0 5.0 - 8.0   Glucose, UA NEGATIVE NEGATIVE mg/dL   Hgb urine dipstick SMALL (A) NEGATIVE   Bilirubin Urine NEGATIVE NEGATIVE   Ketones, ur NEGATIVE NEGATIVE mg/dL   Protein, ur 30 (A) NEGATIVE mg/dL   Nitrite NEGATIVE NEGATIVE   Leukocytes,Ua NEGATIVE NEGATIVE   RBC / HPF 0-5 0 - 5 RBC/hpf   WBC, UA 0-5 0 - 5 WBC/hpf   Bacteria, UA FEW (A) NONE SEEN   Mucus PRESENT    Hyaline Casts, UA PRESENT    Granular Casts, UA PRESENT     Comment: Performed at [facility], [address], [place], [place] [address]  SARS Coronavirus 2 by RT PCR (hospital order, performed in [facility] hospital lab) Nasopharyngeal Nasopharyngeal Swab     Status: None   Collection Time: 12/21/19  2:20 PM   Specimen: Nasopharyngeal Swab  Result Value Ref Range   SARS Coronavirus 2 NEGATIVE NEGATIVE    Comment: (NOTE) SARS-CoV-2 target nucleic acids are NOT DETECTED.  The SARS-CoV-2 RNA is generally detectable in upper and lower respiratory specimens  during the acute phase of infection. The lowest concentration of SARS-CoV-2 viral copies this assay can detect is 250 copies / mL. A negative result does not preclude SARS-CoV-2 infection and should not be used as the sole basis for treatment or other patient management decisions.  A negative result may occur with improper specimen collection / handling, submission of specimen other than nasopharyngeal swab, presence of viral mutation(s) within the areas targeted by this assay, and inadequate number of viral copies (<250 copies / mL). A negative result must be combined with clinical observations, patient history, and epidemiological information.  Fact Sheet for Patients:   BoilerBrush.com.cyhttps://www.fda.gov/media/136312/download  Fact Sheet for Healthcare Providers: https://pope.com/https://www.fda.gov/media/136313/download  This test is not yet approved or  cleared by the [place] FDA  and has been authorized for detection and/or diagnosis of SARS-CoV-2 by FDA under an Emergency Use Authorization (EUA).  This EUA will remain in effect (meaning this test can be used) for the duration of the COVID-19 declaration under Section 564(b)(1) of the Act, 21 U.S.C. section 360bbb-3(b)(1), unless the authorization is terminated or revoked sooner.  Performed at [facility], [address] [place], [place] [id number]     CT Abdomen Pelvis W Contrast  Result Date: 12/21/2019 CLINICAL DATA:  Abdominal pain and fever, possibly diabetic EXAM: CT ABDOMEN AND PELVIS WITH CONTRAST TECHNIQUE: Multidetector CT imaging of the abdomen and pelvis was performed using the standard protocol following bolus administration of intravenous contrast. CONTRAST:  100mL OMNIPAQUE IOHEXOL 300 MG/ML  SOLN COMPARISON:  None FINDINGS: Lower chest: Atelectatic changes are present in the lung bases. Underlying consolidation not fully excluded. Borderline enlarged 12 mm lower mediastinal lymph node (2/19) likely reactive. Normal heart size. Trace pericardial effusion. Hepatobiliary: There is heterogeneous air and fluid containing rim enhancing collection/abscess extending across the tip of the right lobe liver with evidence of direct intrahepatic extension (4/104). Conglomerate size of the extrahepatic component is approximately 16.7 x 10.5 x 7.5 cm although multiloculated collections within the liver parenchyma measure up to 7.1 x 5.5 x 8.4 cm. The gallbladder is largely decompressed with mural thickening and pericholecystic fluid but without wall discontinuity to suggest gallbladder perforation. No biliary ductal dilatation. Pancreas: Unremarkable. No pancreatic ductal dilatation or surrounding inflammatory changes. Spleen: Normal in size. No concerning splenic lesions. Adrenals/Urinary Tract: Normal adrenal glands. Kidneys enhance uniformly and symmetrically with normal excretion. No concerning renal  mass, urolithiasis or hydronephrosis. Urinary bladder is unremarkable aside from some mild indentation of the bladder base by an enlarged prostate. Stomach/Bowel: Distal esophagus, stomach and duodenum are unremarkable. Minimal small bowel thickening in the right upper quadrant is likely reactive. A thick-walled, mucosally appendix courses from the tip of the cecum, terminating along the inferior extent of the large heterogeneous collection in the right upper quadrant and along the inferior liver tip, as detailed above. Within this collection is a 1 cm calculus layering dependently (2/38). There is additional circumferential mural thickening along the ascending and hepatic flexure. More distal colon has a normal appearance. Vascular/Lymphatic: A reactive adenopathy seen in the right upper quadrant. No pathologically enlarged nodes seen in the abdomen or pelvis. No significant vascular findings. Hepatic and portal veins remain patent as imaged. Reproductive: Mild indentation of bladder base by a borderline enlarged prostate. No concerning prostate lesions. Other: Heterogeneous air and fluid containing rim enhancing collection in the right upper quadrant with intrahepatic extension, as detailed above. This collection is intimate both with the superior margin of the hepatic flexure as well as  the tip of the appendix and contains a small calcific radiodensity which could reflect a fecalith. Extensive surrounding phlegmon is noted. No free intraperitoneal air is seen however. No bowel containing hernias Musculoskeletal: No acute osseous abnormality or suspicious osseous lesion. Minimal degenerative changes in the spine. Sclerotic lucent lesion in the left femoral neck, likely synovial pit. IMPRESSION: 1. Large heterogeneous air and fluid containing rim enhancing collection/abscess extending across the tip of the right lobe liver with evidence of direct intrahepatic extension. This collection is intimate both with the  superior margin of the thickened hepatic flexure as well as the tip of a mucosally hyperenhancing appendix, contains a small calcific radiodensity which could reflect a fecalith. This may represent the sequela of a perforated appendicitis or diverticulitis. Extensive surrounding phlegmon is noted as well as likely reactive thickening and inflammatory changes of the gallbladder. 2. No free intraperitoneal air is seen elsewhere. 3. No adjacent traumatic findings of the abdominal wall or liver parenchyma. 4. Atelectatic changes in the lung bases. 5. Trace pericardial effusion. 6. Aortic Atherosclerosis (ICD10-I70.0). These results were called by telephone at the time of interpretation on 12/21/2019 at 4:48 pm to provider Dr. Rubin Payor, who verbally acknowledged these results. Electronically Signed   By: Kreg Shropshire M.D.   On: 12/21/2019 16:51   DG Chest Port 1 View  Result Date: 12/21/2019 CLINICAL DATA:  Sepsis EXAM: PORTABLE CHEST 1 VIEW COMPARISON:  None. FINDINGS: There is elevation of the right hemidiaphragm. The lungs are clear. No pneumothorax or pleural effusion. Cardiac size within normal limits. Pulmonary vascularity is normal. No acute bone abnormality. IMPRESSION: Elevation of the right hemidiaphragm.  No active disease. Electronically Signed   By: Helyn Numbers MD   On: 12/21/2019 15:06     A/P: Raymone Pembroke is an 49 y.o. male with a h/o fall 2 weeks ago and now a large posterior liver abscess.  I have recommended hospital admission and IV abx.  Will consult IR in AM for drain.   Vanita Panda, MD  Colorectal and General Surgery [facility]

## 2019-12-21 NOTE — ED Notes (Signed)
Second Lactic Acid not need per MD.

## 2019-12-21 NOTE — ED Provider Notes (Signed)
  Physical Exam  BP 108/84 (BP Location: Right Arm)   Pulse 98   Temp 98.7 F (37.1 C) (Oral)   Resp 20   Ht 5\' 8"  (1.727 m)   Wt 104.3 kg   SpO2 96%   BMI 34.97 kg/m   Physical Exam  ED Course/Procedures     Procedures  MDM  CT scan has returned with likely liver and extrahepatic abscess.  Somewhat hard to determine what the cause was.  Potentially could be gallstone as initial cause.  Potentially could be a calcified fecalith.  Also potential tip of appendicitis that ruptured and then caused abscess.  No free air.  White count is elevated.  Still has some tachycardia.  Patient did have a fall but this is less of a traumatic appearance.  Will discuss with general surgery.       , MD 12/21/19 [phone]

## 2019-12-21 NOTE — ED Triage Notes (Signed)
Patient reports onset of not feeling well for the past 2-3 weeks.  He has hx of pre diabetes and concerned that he may have diabetes now.  He reports increased thirst, increased urination, weight loss of 30 pounds in 2 weeks.  He also reports periods of confusion and weakness

## 2019-12-21 NOTE — Progress Notes (Signed)
Report received from [facility] RN/ED. Pt arrived to 1323 and transferred to bed without difficulty. Education initiated on use of call bell for needs/safety and diet of ice chips

## 2019-12-21 NOTE — ED Notes (Signed)
MD made aware the patient is still febrile.

## 2019-12-22 ENCOUNTER — Other Ambulatory Visit: Payer: Self-pay

## 2019-12-22 LAB — BASIC METABOLIC PANEL
Anion gap: 21 — ABNORMAL HIGH (ref 5–15)
BUN: 25 mg/dL — ABNORMAL HIGH (ref 6–20)
CO2: 14 mmol/L — ABNORMAL LOW (ref 22–32)
Calcium: 8.6 mg/dL — ABNORMAL LOW (ref 8.9–10.3)
Chloride: 100 mmol/L (ref 98–111)
Creatinine, Ser: 1.67 mg/dL — ABNORMAL HIGH (ref 0.61–1.24)
GFR calc Af Amer: 54 mL/min — ABNORMAL LOW (ref >60)
GFR calc non Af Amer: 47 mL/min — ABNORMAL LOW (ref >60)
Glucose, Bld: 128 mg/dL — ABNORMAL HIGH (ref 70–99)
Potassium: 3.9 mmol/L (ref 3.5–5.1)
Sodium: 135 mmol/L (ref 135–145)

## 2019-12-22 LAB — CBC
HCT: 39 % (ref 39.0–52.0)
Hemoglobin: 12.1 g/dL — ABNORMAL LOW (ref 13.0–17.0)
MCH: 25 pg — ABNORMAL LOW (ref 26.0–34.0)
MCHC: 31 g/dL (ref 30.0–36.0)
MCV: 80.6 fL (ref 80.0–100.0)
Platelets: 237 10*3/uL (ref 150–400)
RBC: 4.84 MIL/uL (ref 4.22–5.81)
RDW: 14.7 % (ref 11.5–15.5)
WBC: 7.9 10*3/uL (ref 4.0–10.5)
nRBC: 0.5 % — ABNORMAL HIGH (ref 0.0–0.2)

## 2019-12-22 LAB — HIV ANTIBODY (ROUTINE TESTING W REFLEX): HIV Screen 4th Generation wRfx: NONREACTIVE

## 2019-12-22 LAB — URINE CULTURE: Culture: NO GROWTH

## 2019-12-22 MED ORDER — ENSURE MAX PROTEIN PO LIQD
11.0000 [oz_av] | Freq: Every day | ORAL | Status: DC
  Administered 2019-12-22 – 2019-12-29 (×6): 11 [oz_av] via ORAL
  Filled 2019-12-22 (×9): qty 330

## 2019-12-22 NOTE — Plan of Care (Signed)
  Problem: Education: Goal: Knowledge of General Education information will improve Description: Including pain rating scale, medication(s)/side effects and non-pharmacologic comfort measures Outcome: Progressing   Problem: Clinical Measurements: Goal: Diagnostic test results will improve Outcome: Progressing   

## 2019-12-22 NOTE — Consult Note (Signed)
Chief Complaint: Patient was seen in consultation today for liver abscess/drain placement.  Referring Physician(s): Sherrie George ([facility])  Supervising Physician: Oley Balm  Patient Status: Dillon Wolfe - In-pt  History of Present Illness: Dillon Wolfe is a 49 y.o. male with an unremarkable past medical history who presented to [facility] ED 12/21/2019 with complaints of abdominal pain, nausea, and chills. Per notes, patient fell approximately 2 weeks ago and has been laying around at home secondary to right-sided back pain. Pain resolved, however patient now with abdominal pain and nausea. In ED, CT abdomen/pelvis revealed a large liver abscess. General surgery was consulted who recommended admission and IR consult for possible drain placement.  CT abdomen/pelvix 12/21/2019: 1. Large heterogeneous air and fluid containing rim enhancing collection/abscess extending across the tip of the right lobe liver with evidence of direct intrahepatic extension. This collection is intimate both with the superior margin of the thickened hepatic flexure as well as the tip of a mucosally hyperenhancing appendix, contains a small calcific radiodensity which could reflect a fecalith. This may represent the sequela of a perforated appendicitis or diverticulitis. Extensive surrounding phlegmon is noted as well as likely reactive thickening and inflammatory changes of the gallbladder. 2. No free intraperitoneal air is seen elsewhere. 3. No adjacent traumatic findings of the abdominal wall or liver parenchyma. 4. Atelectatic changes in the lung bases. 5. Trace pericardial effusion. 6. Aortic Atherosclerosis (ICD10-I70.0).  IR requested by Sherrie George, PA-C for possible image-guided liver abscess drain placement. Patient laying in bed resting comfortably. He responds to voice and answers questions appropriately. No complaints. Denies fever, chills, chest pain, dyspnea, abdominal pain, or headache.  LD  Lovenox SQ 12/21/2019 at 2357.   History reviewed. No pertinent past medical history.  History reviewed. No pertinent surgical history.  Allergies: Patient has no known allergies.  Medications: Prior to Admission medications   Medication Sig Start Date End Date Taking? Authorizing Provider  ibuprofen (ADVIL,MOTRIN) 800 MG tablet Take 1 tablet (800 mg total) by mouth 3 (three) times daily. Patient taking differently: Take 800 mg by mouth in the morning.  09/24/17  Yes Melene Plan, DO  HYDROcodone-acetaminophen (NORCO/VICODIN) 5-325 MG per tablet 1-2 tablets po q 6 hours prn moderate to severe pain Patient not taking: Reported on 12/21/2019 03/01/12   Quita Skye, MD     Family History  Problem Relation Age of Onset  . Obesity Mother   . Heart failure Mother   . Hypertension Mother   . Cancer Father     Social History   Socioeconomic History  . Marital status: Divorced    Spouse name: Not on file  . Number of children: Not on file  . Years of education: Not on file  . Highest education level: Not on file  Occupational History  . Not on file  Tobacco Use  . Smoking status: Light Tobacco Smoker    Types: Cigars  . Smokeless tobacco: Never Used  . Tobacco comment: black and milds when drinking  Vaping Use  . Vaping Use: Never used  Substance and Sexual Activity  . Alcohol use: Not Currently  . Drug use: No  . Sexual activity: Not on file  Other Topics Concern  . Not on file  Social History Narrative  . Not on file   Social Determinants of Health   Financial Resource Strain:   . Difficulty of Paying Living Expenses: Not on file  Food Insecurity:   . Worried About Programme researcher, broadcasting/film/video in the Last Year:  Not on file  . Ran Out of Food in the Last Year: Not on file  Transportation Needs:   . Lack of Transportation (Medical): Not on file  . Lack of Transportation (Non-Medical): Not on file  Physical Activity:   . Days of Exercise per Week: Not on file  . Minutes of  Exercise per Session: Not on file  Stress:   . Feeling of Stress : Not on file  Social Connections:   . Frequency of Communication with Friends and Family: Not on file  . Frequency of Social Gatherings with Friends and Family: Not on file  . Attends Religious Services: Not on file  . Active Member of Clubs or Organizations: Not on file  . Attends Banker Meetings: Not on file  . Marital Status: Not on file     Review of Systems: A 12 point ROS discussed and pertinent positives are indicated in the HPI above.  All other systems are negative.  Review of Systems  Constitutional: Negative for chills and fever.  Respiratory: Negative for shortness of breath and wheezing.   Cardiovascular: Negative for chest pain and palpitations.  Gastrointestinal: Negative for abdominal pain.  Neurological: Negative for headaches.  Psychiatric/Behavioral: Negative for behavioral problems and confusion.    Vital Signs: BP 107/75 (BP Location: Left Arm)   Pulse 85   Temp 98.1 F (36.7 C) (Oral)   Resp 18   Ht 5\' 8"  (1.727 m)   Wt 230 lb (104.3 kg)   SpO2 98%   BMI 34.97 kg/m   Physical Exam Vitals and nursing note reviewed.  Constitutional:      General: He is not in acute distress.    Appearance: Normal appearance.  Cardiovascular:     Rate and Rhythm: Normal rate and regular rhythm.     Heart sounds: Normal heart sounds. No murmur heard.   Pulmonary:     Effort: Pulmonary effort is normal. No respiratory distress.     Breath sounds: Normal breath sounds. No wheezing.  Skin:    General: Skin is warm and dry.  Neurological:     Mental Status: He is alert and oriented to person, place, and time.      MD Evaluation Airway: WNL Heart: WNL Abdomen: WNL Chest/ Lungs: WNL ASA  Classification: 2 Mallampati/Airway Score: One   Imaging: CT Abdomen Pelvis W Contrast  Result Date: 12/21/2019 CLINICAL DATA:  Abdominal pain and fever, possibly diabetic EXAM: CT ABDOMEN  AND PELVIS WITH CONTRAST TECHNIQUE: Multidetector CT imaging of the abdomen and pelvis was performed using the standard protocol following bolus administration of intravenous contrast. CONTRAST:  12/23/2019 OMNIPAQUE IOHEXOL 300 MG/ML  SOLN COMPARISON:  None FINDINGS: Lower chest: Atelectatic changes are present in the lung bases. Underlying consolidation not fully excluded. Borderline enlarged 12 mm lower mediastinal lymph node (2/19) likely reactive. Normal heart size. Trace pericardial effusion. Hepatobiliary: There is heterogeneous air and fluid containing rim enhancing collection/abscess extending across the tip of the right lobe liver with evidence of direct intrahepatic extension (4/104). Conglomerate size of the extrahepatic component is approximately 16.7 x 10.5 x 7.5 cm although multiloculated collections within the liver parenchyma measure up to 7.1 x 5.5 x 8.4 cm. The gallbladder is largely decompressed with mural thickening and pericholecystic fluid but without wall discontinuity to suggest gallbladder perforation. No biliary ductal dilatation. Pancreas: Unremarkable. No pancreatic ductal dilatation or surrounding inflammatory changes. Spleen: Normal in size. No concerning splenic lesions. Adrenals/Urinary Tract: Normal adrenal glands. Kidneys enhance uniformly and  symmetrically with normal excretion. No concerning renal mass, urolithiasis or hydronephrosis. Urinary bladder is unremarkable aside from some mild indentation of the bladder base by an enlarged prostate. Stomach/Bowel: Distal esophagus, stomach and duodenum are unremarkable. Minimal small bowel thickening in the right upper quadrant is likely reactive. A thick-walled, mucosally appendix courses from the tip of the cecum, terminating along the inferior extent of the large heterogeneous collection in the right upper quadrant and along the inferior liver tip, as detailed above. Within this collection is a 1 cm calculus layering dependently (2/38).  There is additional circumferential mural thickening along the ascending and hepatic flexure. More distal colon has a normal appearance. Vascular/Lymphatic: A reactive adenopathy seen in the right upper quadrant. No pathologically enlarged nodes seen in the abdomen or pelvis. No significant vascular findings. Hepatic and portal veins remain patent as imaged. Reproductive: Mild indentation of bladder base by a borderline enlarged prostate. No concerning prostate lesions. Other: Heterogeneous air and fluid containing rim enhancing collection in the right upper quadrant with intrahepatic extension, as detailed above. This collection is intimate both with the superior margin of the hepatic flexure as well as the tip of the appendix and contains a small calcific radiodensity which could reflect a fecalith. Extensive surrounding phlegmon is noted. No free intraperitoneal air is seen however. No bowel containing hernias Musculoskeletal: No acute osseous abnormality or suspicious osseous lesion. Minimal degenerative changes in the spine. Sclerotic lucent lesion in the left femoral neck, likely synovial pit. IMPRESSION: 1. Large heterogeneous air and fluid containing rim enhancing collection/abscess extending across the tip of the right lobe liver with evidence of direct intrahepatic extension. This collection is intimate both with the superior margin of the thickened hepatic flexure as well as the tip of a mucosally hyperenhancing appendix, contains a small calcific radiodensity which could reflect a fecalith. This may represent the sequela of a perforated appendicitis or diverticulitis. Extensive surrounding phlegmon is noted as well as likely reactive thickening and inflammatory changes of the gallbladder. 2. No free intraperitoneal air is seen elsewhere. 3. No adjacent traumatic findings of the abdominal wall or liver parenchyma. 4. Atelectatic changes in the lung bases. 5. Trace pericardial effusion. 6. Aortic  Atherosclerosis (ICD10-I70.0). These results were called by telephone at the time of interpretation on 12/21/2019 at 4:48 pm to provider Dr. Rubin Payor, who verbally acknowledged these results. Electronically Signed   By: Kreg Shropshire M.D.   On: 12/21/2019 16:51   DG Chest Port 1 View  Result Date: 12/21/2019 CLINICAL DATA:  Sepsis EXAM: PORTABLE CHEST 1 VIEW COMPARISON:  None. FINDINGS: There is elevation of the right hemidiaphragm. The lungs are clear. No pneumothorax or pleural effusion. Cardiac size within normal limits. Pulmonary vascularity is normal. No acute bone abnormality. IMPRESSION: Elevation of the right hemidiaphragm.  No active disease. Electronically Signed   By: Helyn Numbers MD   On: 12/21/2019 15:06    Labs:  CBC: Recent Labs    12/21/19 1300 12/22/19 0310  WBC 22.2* 7.9  HGB 11.1* 12.1*  HCT 32.7* 39.0  PLT 289 237    COAGS: Recent Labs    12/21/19 1420  INR 1.3*  APTT 45*    BMP: Recent Labs    12/21/19 1300 12/22/19 0310  NA 131* 135  K 4.6 3.9  CL 95* 100  CO2 21* 14*  GLUCOSE 179* 128*  BUN 24* 25*  CALCIUM 8.8* 8.6*  CREATININE 1.71* 1.67*  GFRNONAA 46* 47*  GFRAA 53* 54*    LIVER  FUNCTION TESTS: Recent Labs    12/21/19 1300  BILITOT 2.2*  AST 79*  ALT 75*  ALKPHOS 111  PROT 8.6*  ALBUMIN 2.5*     Assessment and Plan:  Liver abscess. Plan for image-guided liver abscess drain placement in IR tentatively for tomorrow 12/23/2019 pending IR scheduling. Patient will be NPO at midnight. Afebrile and WBCs WNL. Will hold Lovenox per IR protocol. INR 1.3 12/21/2019.  Risks and benefits discussed with the patient including bleeding, infection, damage to adjacent structures, bowel perforation/fistula connection, and sepsis. All of the patient's questions were answered, patient is agreeable to proceed. Consent signed and in chart.   Thank you for this interesting consult.  I greatly enjoyed meeting Dillon Wolfe and look forward  to participating in their care.  A copy of this report was sent to the requesting provider on this date.  Electronically Signed: Elwin MochaAlexandra Jaylin Benzel, PA-C 12/22/2019, 9:47 AM   I spent a total of 40 Minutes in face to face in clinical consultation, greater than 50% of which was counseling/coordinating care for liver abscess/drain placement.

## 2019-12-22 NOTE — Progress Notes (Signed)
Initial Nutrition Assessment  DOCUMENTATION CODES:   Obesity unspecified  INTERVENTION:   -Ensure MAX Protein po daily, each supplement provides 150 kcal and 30 grams of protein  NUTRITION DIAGNOSIS:   Increased nutrient needs related to acute illness (liver abscess) as evidenced by estimated needs.  GOAL:   Patient will meet greater than or equal to 90% of their needs  MONITOR:   PO intake, Supplement acceptance, Labs, Weight trends, I & O's  REASON FOR ASSESSMENT:   Malnutrition Screening Tool    ASSESSMENT:   49 y.o. male with a h/o fall 2 weeks ago and now a large posterior liver abscess.  Patient reports abdominal pain and N/V PTA. Pt has poor appetite r/t to pain. Pt is on a diet today but will be NPO tomorrow for possible drain placement for liver abscess.   Pt did not eat breakfast but did consume 80% of lunch. Will add Ensure Max supplement for extra kcals and protein.  Pt reports weight loss of 30 lbs over the past 2 weeks. No weight records available to verify. This would be significant for time frame.   Medications: D5 infusion Labs reviewed:  CBGs; 195  NUTRITION - FOCUSED PHYSICAL EXAM:  No depletions noted.  Diet Order:   Diet Order            Diet NPO time specified  Diet effective midnight           Diet regular Room service appropriate? Yes; Fluid consistency: Thin  Diet effective now                 EDUCATION NEEDS:   No education needs have been identified at this time  Skin:  Skin Assessment: Reviewed RN Assessment  Last BM:  9/15  Height:   Ht Readings from Last 1 Encounters:  12/22/19 5\' 8"  (1.727 m)    Weight:   Wt Readings from Last 1 Encounters:  12/22/19 104.3 kg   BMI:  Body mass index is 34.97 kg/m.  Estimated Nutritional Needs:   Kcal:  1900-2100  Protein:  80-90g  Fluid:  2L/day  12/24/19, MS, RD, LDN Inpatient Clinical Dietitian Contact information available via Amion

## 2019-12-22 NOTE — Progress Notes (Signed)
Subjective No acute events. Feeling about the same as last night. Hungry. Denies complaints at this time  Objective: Vital signs in last 24 hours: Temp:  [98.1 F (36.7 C)-101.3 F (38.5 C)] 98.1 F (36.7 C) (09/16 0141) Pulse Rate:  [42-132] 85 (09/16 0141) Resp:  [17-37] 18 (09/16 0141) BP: (85-113)/(58-87) 107/75 (09/16 0141) SpO2:  [46 %-100 %] 98 % (09/16 0141) Weight:  [104.3 kg] 104.3 kg (09/16 0025) Last BM Date: 12/21/19  Intake/Output from previous day: 09/15 0701 - 09/16 0700 In: 2362.3 [I.V.:222.3; IV Piggyback:2140] Out: -  Intake/Output this shift: No intake/output data recorded.  Gen: NAD, comfortable CV: RRR Pulm: Normal work of breathing Abd: Soft, mildly ttp in RUQ. No rebound. No guarding Ext: SCDs in place  Lab Results: CBC  Recent Labs    12/21/19 1300 12/22/19 0310  WBC 22.2* 7.9  HGB 11.1* 12.1*  HCT 32.7* 39.0  PLT 289 237   BMET Recent Labs    12/21/19 1300 12/22/19 0310  NA 131* 135  K 4.6 3.9  CL 95* 100  CO2 21* 14*  GLUCOSE 179* 128*  BUN 24* 25*  CREATININE 1.71* 1.67*  CALCIUM 8.8* 8.6*   PT/INR Recent Labs    12/21/19 1420  LABPROT 15.3*  INR 1.3*   ABG No results for input(s): PHART, HCO3 in the last 72 hours.  Invalid input(s): PCO2, PO2  Studies/Results:  Anti-infectives: Anti-infectives (From admission, onward)   Start     Dose/Rate Route Frequency Ordered Stop   12/21/19 2200  piperacillin-tazobactam (ZOSYN) IVPB 3.375 g        3.375 g 12.5 mL/hr over 240 Minutes Intravenous Every 8 hours 12/21/19 2110     12/21/19 1430  piperacillin-tazobactam (ZOSYN) IVPB 3.375 g        3.375 g 100 mL/hr over 30 Minutes Intravenous  Once 12/21/19 1418 12/21/19 1527       Assessment/Plan: Patient Active Problem List   Diagnosis Date Noted  . Liver abscess 12/21/2019    -Case was discussed with IR - he received lovenox last night and they request we discontinue this and they do procedure tomorrow. -Will  therefore allow him to eat today. NPO after midnight -Continue broad spec IV abx -SCDs; I have discontinued chemical dvt prophylaxis at request of IR   LOS: 1 day   Stephanie Coup. Cliffton Asters, M.D. [facility] Use [email or web address] to contact on call provider

## 2019-12-23 ENCOUNTER — Inpatient Hospital Stay (HOSPITAL_COMMUNITY): Payer: Medicaid Other

## 2019-12-23 ENCOUNTER — Encounter (HOSPITAL_COMMUNITY): Payer: Self-pay

## 2019-12-23 DIAGNOSIS — A498 Other bacterial infections of unspecified site: Secondary | ICD-10-CM

## 2019-12-23 DIAGNOSIS — K3532 Acute appendicitis with perforation and localized peritonitis, without abscess: Secondary | ICD-10-CM

## 2019-12-23 LAB — BLOOD CULTURE ID PANEL (REFLEXED) - BCID2

## 2019-12-23 MED ORDER — DIPHENHYDRAMINE HCL 25 MG PO CAPS: 25.0000 mg | ORAL_CAPSULE | Freq: Four times a day (QID) | ORAL | Status: DC | PRN

## 2019-12-23 MED ORDER — CHLORHEXIDINE GLUCONATE CLOTH 2 % EX PADS
6.0000 | MEDICATED_PAD | Freq: Every day | CUTANEOUS | Status: DC
  Administered 2019-12-24 – 2019-12-30 (×6): 6 via TOPICAL

## 2019-12-23 MED ORDER — DIPHENHYDRAMINE HCL 50 MG/ML IJ SOLN: 25.0000 mg | Freq: Four times a day (QID) | INTRAMUSCULAR | Status: DC | PRN

## 2019-12-23 MED ORDER — MIDAZOLAM HCL 2 MG/2ML IJ SOLN
INTRAMUSCULAR | Status: AC | PRN
  Administered 2019-12-23 (×2): 1 mg via INTRAVENOUS

## 2019-12-23 MED ORDER — MELATONIN 3 MG PO TABS
3.0000 mg | ORAL_TABLET | Freq: Every day | ORAL | Status: DC
  Administered 2019-12-23 – 2019-12-27 (×5): 3 mg via ORAL
  Filled 2019-12-23 (×7): qty 1

## 2019-12-23 MED ORDER — NALOXONE HCL 0.4 MG/ML IJ SOLN
INTRAMUSCULAR | Status: AC
  Filled 2019-12-23: qty 1

## 2019-12-23 MED ORDER — SODIUM CHLORIDE 0.9% FLUSH
10.0000 mL | INTRAVENOUS | Status: DC | PRN
  Administered 2019-12-27: 10 mL

## 2019-12-23 MED ORDER — LIDOCAINE-EPINEPHRINE (PF) 1 %-1:200000 IJ SOLN
INTRAMUSCULAR | Status: AC | PRN
  Administered 2019-12-23: 10 mL via INTRADERMAL

## 2019-12-23 MED ORDER — FENTANYL CITRATE (PF) 100 MCG/2ML IJ SOLN
INTRAMUSCULAR | Status: AC | PRN
  Administered 2019-12-23: 50 ug via INTRAVENOUS

## 2019-12-23 MED ORDER — MIDAZOLAM HCL 2 MG/2ML IJ SOLN
INTRAMUSCULAR | Status: AC
  Filled 2019-12-23: qty 6

## 2019-12-23 MED ORDER — FLUMAZENIL 0.5 MG/5ML IV SOLN
INTRAVENOUS | Status: AC
  Filled 2019-12-23: qty 5

## 2019-12-23 MED ORDER — LIP MEDEX EX OINT
TOPICAL_OINTMENT | CUTANEOUS | Status: AC
  Administered 2019-12-23: 1
  Filled 2019-12-23: qty 7

## 2019-12-23 MED ORDER — SODIUM CHLORIDE 0.9 % IV SOLN
INTRAVENOUS | Status: AC | PRN
  Administered 2019-12-23: 250 mL via INTRAVENOUS

## 2019-12-23 MED ORDER — FENTANYL CITRATE (PF) 100 MCG/2ML IJ SOLN
INTRAMUSCULAR | Status: AC
  Filled 2019-12-23: qty 4

## 2019-12-23 MED ORDER — SODIUM CHLORIDE 0.9% FLUSH
5.0000 mL | Freq: Three times a day (TID) | INTRAVENOUS | Status: DC
  Administered 2019-12-24 – 2019-12-30 (×19): 5 mL

## 2019-12-23 NOTE — Progress Notes (Signed)
Subjective No acute events. Feeling better. Hungry. Denies complaints at this time  Objective: Vital signs in last 24 hours: Temp:  [97.8 F (36.6 C)-98.4 F (36.9 C)] 97.8 F (36.6 C) (09/17 0641) Pulse Rate:  [89-100] 89 (09/17 0641) Resp:  [18] 18 (09/17 0641) BP: (86-104)/(62-72) 93/68 (09/17 0641) SpO2:  [91 %-97 %] 97 % (09/17 0641) Last BM Date: 12/22/19  Intake/Output from previous day: 09/16 0701 - 09/17 0700 In: 2698.1 [P.O.:360; I.V.:2176.1; IV Piggyback:162] Out: -  Intake/Output this shift: No intake/output data recorded.  Gen: NAD, comfortable CV: RRR Pulm: Normal work of breathing Abd: Soft, mildly ttp in RUQ. No rebound. No guarding Ext: SCDs in place  Lab Results: CBC  Recent Labs    12/21/19 1300 12/22/19 0310  WBC 22.2* 7.9  HGB 11.1* 12.1*  HCT 32.7* 39.0  PLT 289 237   BMET Recent Labs    12/21/19 1300 12/22/19 0310  NA 131* 135  K 4.6 3.9  CL 95* 100  CO2 21* 14*  GLUCOSE 179* 128*  BUN 24* 25*  CREATININE 1.71* 1.67*  CALCIUM 8.8* 8.6*   PT/INR Recent Labs    12/21/19 1420  LABPROT 15.3*  INR 1.3*   ABG No results for input(s): PHART, HCO3 in the last 72 hours.  Invalid input(s): PCO2, PO2  Studies/Results:  Anti-infectives: Anti-infectives (From admission, onward)   Start     Dose/Rate Route Frequency Ordered Stop   12/21/19 2200  piperacillin-tazobactam (ZOSYN) IVPB 3.375 g        3.375 g 12.5 mL/hr over 240 Minutes Intravenous Every 8 hours 12/21/19 2110     12/21/19 1430  piperacillin-tazobactam (ZOSYN) IVPB 3.375 g        3.375 g 100 mL/hr over 30 Minutes Intravenous  Once 12/21/19 1418 12/21/19 1527       Assessment/Plan: Patient Active Problem List   Diagnosis Date Noted  . Liver abscess 12/21/2019   Possibly perforated gallbladder based on what appears to be intraperitoneal gallstone; total bilirubin elevated  -IR today for drainage -Repeat CMP, monitor drain outputs -Ok for diet following -  regular -Continue broad spec IV abx -SCDs; I have discontinued chemical dvt prophylaxis at request of IR   LOS: 2 days   Stephanie Coup. Cliffton Asters, M.D. [facility] Use [email or web address] to contact on call provider

## 2019-12-23 NOTE — Plan of Care (Signed)
Plan of care reviewed and discussed with the patient. 

## 2019-12-23 NOTE — Plan of Care (Signed)

## 2019-12-23 NOTE — Consult Note (Signed)
[facility]    Date of Admission:  12/21/2019   Total days of antibiotics: 2 zosyn               Reason for Consult: Bacteroides fragilis bacteremia    Referring Provider: White   Assessment: Liver abscess Perforated appendicitis Bacteroides fragilis bacteremia  Plan:  1. Continue zosyn for now, potentially home on Invanz? 2. Drain mgmt 3. Mid-line, discussed with pt.  4. Repeat BCx.  5. Surgery for perforated appendix? 6. Aim for 3 weeks of IV then repeat imaging, potentially change to po.   Thank you so much for this interesting consult,  Active Problems:   Liver abscess   . docusate sodium  100 mg Oral BID  . melatonin  3 mg Oral QHS  . Ensure Max Protein  11 oz Oral Daily  . sodium chloride flush  5 mL Intracatheter Q8H    HPI: Dillon Wolfe is a 49 y.o. male with no pmhx comes to ED on 9-15 with 2 weeks of fever and chills, diaphoresis and excessive thirst. He also lost 30#.  In Ed he had temp 101.2, WBC 22.2. He was found on CT ab to have 16.7 x 10.5 x 7.5 cm liver abscess.  He was seen by surgery and IR and had IR drain placed 12-23-19.  His findings are now felt to be consistent with perforated appendicitis.  Denies pain from drainage catheter.   Review of Systems: Review of Systems  Constitutional: Positive for chills, diaphoresis, fever and weight loss.  Gastrointestinal: Positive for abdominal pain, nausea and vomiting. Negative for diarrhea.       Dumping  Genitourinary: Negative for dysuria.  Please see HPI. All other systems reviewed and negative.   History reviewed. No pertinent past medical history.  Social History   Tobacco Use  . Smoking status: Light Tobacco Smoker    Types: Cigars  . Smokeless tobacco: Never Used  . Tobacco comment: black and milds when drinking  Vaping Use  . Vaping Use: Never used  Substance Use Topics  . Alcohol use: Not Currently  . Drug use: No    Family History    Problem Relation Age of Onset  . Obesity Mother   . Heart failure Mother   . Hypertension Mother   . Cancer Father      Medications:  Scheduled: . docusate sodium  100 mg Oral BID  . melatonin  3 mg Oral QHS  . Ensure Max Protein  11 oz Oral Daily  . sodium chloride flush  5 mL Intracatheter Q8H    Abtx:  Anti-infectives (From admission, onward)   Start     Dose/Rate Route Frequency Ordered Stop   12/21/19 2200  piperacillin-tazobactam (ZOSYN) IVPB 3.375 g        3.375 g 12.5 mL/hr over 240 Minutes Intravenous Every 8 hours 12/21/19 2110     12/21/19 1430  piperacillin-tazobactam (ZOSYN) IVPB 3.375 g        3.375 g 100 mL/hr over 30 Minutes Intravenous  Once 12/21/19 1418 12/21/19 1527        OBJECTIVE: Blood pressure 101/73, pulse 84, temperature 98.2 F (36.8 C), resp. rate 16, height 5\' 8"  (1.727 m), weight 104.3 kg, SpO2 98 %.  Physical Exam Vitals reviewed.  HENT:     Mouth/Throat:     Mouth: Mucous membranes are moist.     Pharynx: No oropharyngeal exudate.  Eyes:  Extraocular Movements: Extraocular movements intact.     Pupils: Pupils are equal, round, and reactive to light.  Cardiovascular:     Rate and Rhythm: Normal rate and regular rhythm.  Pulmonary:     Effort: Pulmonary effort is normal.     Breath sounds: Normal breath sounds.  Abdominal:     General: Bowel sounds are normal. There is no distension.     Palpations: Abdomen is soft.     Tenderness: There is no abdominal tenderness.    Musculoskeletal:        General: No swelling.     Cervical back: Normal range of motion and neck supple.     Right lower leg: No edema.     Left lower leg: No edema.  Neurological:     General: No focal deficit present.  Psychiatric:        Mood and Affect: Mood normal.     Lab Results Results for orders placed or performed during the hospital encounter of 12/21/19 (from the past 48 hour(s))  HIV Antibody (routine testing w rflx)     Status: None    Collection Time: 12/22/19  3:10 AM  Result Value Ref Range   HIV Screen 4th Generation wRfx Non Reactive Non Reactive    Comment: Performed at [facility], [address], [place], [place] [address]  CBC     Status: Abnormal   Collection Time: 12/22/19  3:10 AM  Result Value Ref Range   WBC 7.9 4.0 - 10.5 K/uL   RBC 4.84 4.22 - 5.81 MIL/uL   Hemoglobin 12.1 (L) 13.0 - 17.0 g/dL   HCT 60.4 39 - 52 %   MCV 80.6 80.0 - 100.0 fL    Comment: REPEATED TO VERIFY DELTA CHECK NOTED    MCH 25.0 (L) 26.0 - 34.0 pg   MCHC 31.0 30.0 - 36.0 g/dL   RDW 54.0 98.1 - 19.1 %   Platelets 237 150 - 400 K/uL   nRBC 0.5 (H) 0.0 - 0.2 %    Comment: Performed at [facility], [address] [place], [place] [address]  Basic metabolic panel     Status: Abnormal   Collection Time: 12/22/19  3:10 AM  Result Value Ref Range   Sodium 135 135 - 145 mmol/L   Potassium 3.9 3.5 - 5.1 mmol/L   Chloride 100 98 - 111 mmol/L   CO2 14 (L) 22 - 32 mmol/L   Glucose, Bld 128 (H) 70 - 99 mg/dL    Comment: Glucose reference range applies only to samples taken after fasting for at least 8 hours.   BUN 25 (H) 6 - 20 mg/dL   Creatinine, Ser 5.62 (H) 0.61 - 1.24 mg/dL   Calcium 8.6 (L) 8.9 - 10.3 mg/dL   GFR calc non Af Amer 47 (L) >60 mL/min   GFR calc Af Amer 54 (L) >60 mL/min   Anion gap 21 (H) 5 - 15    Comment: Performed at [facility], [address], [place], [place] [address]      Component Value Date/Time   SDES  12/21/2019 1420    IN/OUT CATH URINE Performed at [facility], [address] [place], [place] [address]    SDES  12/21/2019 1420    BLOOD RIGHT HAND Performed at [facility], [address] [place], [place] [address]    SDES  12/21/2019 1420    BLOOD LEFT ANTECUBITAL Performed at [facility], [address], [place], [place] [address]    SPECREQUEST  12/21/2019 1420    NONE Performed at [facility], [address], [place], [place] [address]    SPECREQUEST  12/21/2019 1420    BOTTLES DRAWN AEROBIC AND ANAEROBIC Blood Culture adequate volume Performed at [facility], [address] [place], [place] [address]    SPECREQUEST  12/21/2019 1420    BOTTLES DRAWN AEROBIC AND ANAEROBIC Blood Culture adequate volume Performed at [facility], [address] [place], [place] [address]    CULT  12/21/2019 1420    NO GROWTH Performed at [facility], [address] [place], [place] [address]    Elpidio Anis NEGATIVE RODS 12/21/2019 1420   CULT  12/21/2019 1420    NO GROWTH 2 DAYS Performed at [facility], [address] [place], [place] [address]    REPTSTATUS 12/22/2019 FINAL 12/21/2019 1420   REPTSTATUS PENDING 12/21/2019 1420   REPTSTATUS PENDING 12/21/2019 1420   CT Abdomen Pelvis W Contrast  Result Date: 12/21/2019 CLINICAL DATA:  Abdominal pain and fever, possibly diabetic EXAM: CT ABDOMEN AND PELVIS WITH CONTRAST TECHNIQUE: Multidetector CT imaging of the abdomen and pelvis was performed using the standard protocol following bolus administration of intravenous contrast. CONTRAST:  OMNIPAQUE IOHEXOL 300 MG/ML  SOLN COMPARISON:  None FINDINGS: Lower chest: Atelectatic changes are present in the lung bases. Underlying consolidation not fully excluded. Borderline enlarged 12 mm lower mediastinal lymph node (2/19) likely reactive. Normal heart size. Trace pericardial effusion. Hepatobiliary: There is heterogeneous air and fluid containing rim enhancing collection/abscess extending across the tip of the right lobe liver with evidence of direct intrahepatic extension (4/104). Conglomerate size of the extrahepatic component is approximately 16.7 x 10.5 x 7.5 cm although multiloculated collections within the liver parenchyma measure up to 7.1 x 5.5 x 8.4 cm. The gallbladder is largely decompressed with mural  thickening and pericholecystic fluid but without wall discontinuity to suggest gallbladder perforation. No biliary ductal dilatation. Pancreas: Unremarkable. No pancreatic ductal dilatation or surrounding inflammatory changes. Spleen: Normal in size. No concerning splenic lesions. Adrenals/Urinary Tract: Normal adrenal glands. Kidneys enhance uniformly and symmetrically with normal excretion. No concerning renal mass, urolithiasis or hydronephrosis. Urinary bladder is unremarkable aside from some mild indentation of the bladder base by an enlarged prostate. Stomach/Bowel: Distal esophagus, stomach and duodenum are unremarkable. Minimal small bowel thickening in the right upper quadrant is likely reactive. A thick-walled, mucosally appendix courses from the tip of the cecum, terminating along the inferior extent of the large heterogeneous collection in the right upper quadrant and along the inferior liver tip, as detailed above. Within this collection is a 1 cm calculus layering dependently (2/38). There is additional circumferential mural thickening along the ascending and hepatic flexure. More distal colon has a normal appearance. Vascular/Lymphatic: A reactive adenopathy seen in the right upper quadrant. No pathologically enlarged nodes seen in the abdomen or pelvis. No significant vascular findings. Hepatic and portal veins remain patent as imaged. Reproductive: Mild indentation of bladder base by a borderline enlarged prostate. No concerning prostate lesions. Other: Heterogeneous air and fluid containing rim enhancing collection in the right upper quadrant with intrahepatic extension, as detailed above. This collection is intimate both with the superior margin of the hepatic flexure as well as the tip of the appendix and contains a small calcific radiodensity which could reflect a fecalith. Extensive surrounding phlegmon is noted. No free intraperitoneal  air is seen however. No bowel containing hernias  Musculoskeletal: No acute osseous abnormality or suspicious osseous lesion. Minimal degenerative changes in the spine. Sclerotic lucent lesion in the left femoral neck, likely synovial pit. IMPRESSION: 1. Large heterogeneous air and fluid containing rim enhancing collection/abscess extending across the tip of the right lobe liver with evidence of direct intrahepatic extension. This collection is intimate both with the superior margin of the thickened hepatic flexure as well as the tip of a mucosally hyperenhancing appendix, contains a small calcific radiodensity which could reflect a fecalith. This may represent the sequela of a perforated appendicitis or diverticulitis. Extensive surrounding phlegmon is noted as well as likely reactive thickening and inflammatory changes of the gallbladder. 2. No free intraperitoneal air is seen elsewhere. 3. No adjacent traumatic findings of the abdominal wall or liver parenchyma. 4. Atelectatic changes in the lung bases. 5. Trace pericardial effusion. 6. Aortic Atherosclerosis (ICD10-I70.0). These results were called by telephone at the time of interpretation on 12/21/2019 at 4:48 pm to provider Dr. Rubin Payor, who verbally acknowledged these results. Electronically Signed   By: Kreg Shropshire M.D.   On: 12/21/2019 16:51   CT IMAGE GUIDED DRAINAGE BY PERCUTANEOUS CATHETER  Result Date: 12/23/2019 INDICATION: 49 year old male with intra-abdominal/intrahepatic fluid collection concerning for abscess. EXAM: CT-guided drain placement MEDICATIONS: The patient is currently admitted to the hospital and receiving intravenous antibiotics. The antibiotics were administered within an appropriate time frame prior to the initiation of the procedure. ANESTHESIA/SEDATION: Fentanyl 50 mcg IV; Versed 2 mg IV Moderate Sedation Time:  22 The patient was continuously monitored during the procedure by the interventional radiology nurse under my direct supervision. COMPLICATIONS: None immediate.  PROCEDURE: Informed written consent was obtained from the patient after a thorough discussion of the procedural risks, benefits and alternatives. All questions were addressed. Maximal Sterile Barrier Technique was utilized including caps, mask, sterile gowns, sterile gloves, sterile drape, hand hygiene and skin antiseptic. A timeout was performed prior to the initiation of the procedure. The patient's right upper quadrant was prepped and draped in standard fashion. Planning CT demonstrated similar appearance of previously visualized intrahepatic multi loculated fluid collection containing an approximately 1.3 cm radiopaque calculus in the periphery. There are scattered air-fluid levels. The procedure was planned. A small amount of local anesthesia was provided at the skin entry site with 1% lidocaine with epinephrine. Deeper local anesthesia was provided along the planned drain entry path. An 18 gauge trocar needle was advanced under intermittent CT fluoroscopy into the fluid collection. Immediately, there was peroneal aunt, foul-smelling aspirate from the initial needle placement. An Amplatz wire was coiled within the fluid collection. Serial dilation was performed with 8 and 12 [place] dilators. A 12 French pigtail drainage catheter was advanced into the fluid collection in the pigtail portion with locked. Approximately 200 mL of purulence aspirate were removed and the drain was then placed to bag drainage. The samples were sent to the lab for culture. The patient tolerated the procedure well without immediate complication. IMPRESSION: Successful CT-guided abdominal/intrahepatic drain placement. Preprocedure CT demonstrates fluid collection originating at the tip of the appendix. Calculus within the fluid collection likely represents appendicoliths. The gallbladder appears minimally inflamed, likely reactive, but intact and separate from the fluid collection, suggesting etiology of fluid collection is secondary to  perforated appendicitis. Electronically Signed   By: Marliss Coots MD   On: 12/23/2019 13:02   Recent Results (from the past 240 hour(s))  Urine culture     Status: None  Collection Time: 12/21/19  2:20 PM   Specimen: In/Out Cath Urine  Result Value Ref Range Status   Specimen Description   Final    IN/OUT CATH URINE Performed at [facility], [address], [place], [place] [address]    Special Requests   Final    NONE Performed at [facility], [address], [place], [place] [address]    Culture   Final    NO GROWTH Performed at [facility], [address], [place], [place] [address]    Report Status 12/22/2019 FINAL  Final  Blood Culture (routine x 2)     Status: None (Preliminary result)   Collection Time: 12/21/19  2:20 PM   Specimen: BLOOD RIGHT HAND  Result Value Ref Range Status   Specimen Description   Final    BLOOD RIGHT HAND Performed at [facility], [address], [place], [place] [address]    Special Requests   Final    BOTTLES DRAWN AEROBIC AND ANAEROBIC Blood Culture adequate volume Performed at [facility] [address], [place], [place] [address]    Culture  Setup Time   Final    GRAM NEGATIVE RODS ANAEROBIC BOTTLE ONLY Organism ID to follow CRITICAL RESULT CALLED TO, READ BACK BY AND VERIFIED WITH: Azzie Glatter PHARMD, AT 1310 12/23/19 BY D. VANHOOK Performed at [facility], [address], [place], [place] [address]    Culture GRAM NEGATIVE RODS  Final   Report Status PENDING  Incomplete  Blood Culture (routine x 2)     Status: None (Preliminary result)   Collection Time: 12/21/19  2:20 PM   Specimen: BLOOD  Result Value Ref Range Status   Specimen Description   Final    BLOOD LEFT ANTECUBITAL Performed at [facility], [address], [place], [place] [address]    Special Requests   Final    BOTTLES DRAWN AEROBIC AND ANAEROBIC Blood Culture  adequate volume Performed at [facility], [address] [place], [place] [address]    Culture   Final    NO GROWTH 2 DAYS Performed at [facility], [address], [place], [place] [address]    Report Status PENDING  Incomplete  SARS Coronavirus 2 by RT PCR (hospital order, performed in [facility] hospital lab) Nasopharyngeal Nasopharyngeal Swab     Status: None   Collection Time: 12/21/19  2:20 PM   Specimen: Nasopharyngeal Swab  Result Value Ref Range Status   SARS Coronavirus 2 NEGATIVE NEGATIVE Final    Comment: (NOTE) SARS-CoV-2 target nucleic acids are NOT DETECTED.  The SARS-CoV-2 RNA is generally detectable in upper and lower respiratory specimens during the acute phase of infection. The lowest concentration of SARS-CoV-2 viral copies this assay can detect is 250 copies / mL. A negative result does not preclude SARS-CoV-2 infection and should not be used as the sole basis for treatment or other patient management decisions.  A negative result may occur with improper specimen collection / handling, submission of specimen other than nasopharyngeal swab, presence of viral mutation(s) within the areas targeted by this assay, and inadequate number of viral copies (<250 copies / mL). A negative result must be combined with clinical observations, patient history, and epidemiological information.  Fact Sheet for Patients:   BoilerBrush.com.cy  Fact Sheet for Healthcare Providers: https://pope.com/  This test is not yet approved or  cleared by the [place] FDA  and has been authorized for detection and/or diagnosis of SARS-CoV-2 by FDA under an Emergency Use Authorization (EUA).  This EUA will remain in effect (meaning this test can be used) for the duration of the COVID-19 declaration under Section 564(b)(1) of the Act, 21 U.S.C. section 360bbb-3(b)(1), unless the authorization is terminated  or revoked sooner.  Performed at [facility], [address] [place], [place] [address]   Blood Culture ID Panel (Reflexed)     Status: Abnormal   Collection Time: 12/21/19  2:20 PM  Result Value Ref Range Status   Enterococcus faecalis NOT DETECTED NOT DETECTED Final   Enterococcus Faecium NOT DETECTED NOT DETECTED Final   Listeria monocytogenes NOT DETECTED NOT DETECTED Final   Staphylococcus species NOT DETECTED NOT DETECTED Final   Staphylococcus aureus (BCID) NOT DETECTED NOT DETECTED Final   Staphylococcus epidermidis NOT DETECTED NOT DETECTED Final   Staphylococcus lugdunensis NOT DETECTED NOT DETECTED Final   Streptococcus species NOT DETECTED NOT DETECTED Final   Streptococcus agalactiae NOT DETECTED NOT DETECTED Final   Streptococcus pneumoniae NOT DETECTED NOT DETECTED Final   Streptococcus pyogenes NOT DETECTED NOT DETECTED Final   A.calcoaceticus-baumannii NOT DETECTED NOT DETECTED Final   Bacteroides fragilis DETECTED (A) NOT DETECTED Final    Comment: CRITICAL RESULT CALLED TO, READ BACK BY AND VERIFIED WITH: J. LEGGE PHARMD, AT 1310 12/23/19 BY D. VANHOOK    Enterobacterales NOT DETECTED NOT DETECTED Final   Enterobacter cloacae complex NOT DETECTED NOT DETECTED Final   Escherichia coli NOT DETECTED NOT DETECTED Final   Klebsiella aerogenes NOT DETECTED NOT DETECTED Final   Klebsiella oxytoca NOT DETECTED NOT DETECTED Final   Klebsiella pneumoniae NOT DETECTED NOT DETECTED Final   Proteus species NOT DETECTED NOT DETECTED Final   Salmonella species NOT DETECTED NOT DETECTED Final   Serratia marcescens NOT DETECTED NOT DETECTED Final   Haemophilus influenzae NOT DETECTED NOT DETECTED Final   Neisseria meningitidis NOT DETECTED NOT DETECTED Final   Pseudomonas aeruginosa NOT DETECTED NOT DETECTED Final   Stenotrophomonas maltophilia NOT DETECTED NOT DETECTED Final   Candida albicans NOT DETECTED NOT DETECTED Final   Candida auris NOT DETECTED  NOT DETECTED Final   Candida glabrata NOT DETECTED NOT DETECTED Final   Candida krusei NOT DETECTED NOT DETECTED Final   Candida parapsilosis NOT DETECTED NOT DETECTED Final   Candida tropicalis NOT DETECTED NOT DETECTED Final   Cryptococcus neoformans/gattii NOT DETECTED NOT DETECTED Final    Comment: Performed at [facility], [address] [place], [place] [address]    Microbiology: Recent Results (from the past 240 hour(s))  Urine culture     Status: None   Collection Time: 12/21/19  2:20 PM   Specimen: In/Out Cath Urine  Result Value Ref Range Status   Specimen Description   Final    IN/OUT CATH URINE Performed at [facility], [address] [place], [place] [address]    Special Requests   Final    NONE Performed at [facility], [address] [place], [place] [address]    Culture   Final    NO GROWTH Performed at [facility], [address] [place], [place] [address]    Report Status 12/22/2019 FINAL  Final  Blood Culture (routine x 2)     Status: None (Preliminary result)   Collection Time: 12/21/19  2:20 PM   Specimen: BLOOD RIGHT HAND  Result Value Ref Range Status   Specimen Description  Final    BLOOD RIGHT HAND Performed at [facility], [address] [place], [place] [address]    Special Requests   Final    BOTTLES DRAWN AEROBIC AND ANAEROBIC Blood Culture adequate volume Performed at [facility], [address] [place], [place] [address]    Culture  Setup Time   Final    GRAM NEGATIVE RODS ANAEROBIC BOTTLE ONLY Organism ID to follow CRITICAL RESULT CALLED TO, READ BACK BY AND VERIFIED WITH: Azzie Glatter PHARMD, AT 1310 12/23/19 BY D. VANHOOK Performed at [facility], [address], [place], [place] [address]    Culture GRAM NEGATIVE RODS  Final   Report Status PENDING  Incomplete  Blood Culture (routine x 2)     Status: None (Preliminary result)    Collection Time: 12/21/19  2:20 PM   Specimen: BLOOD  Result Value Ref Range Status   Specimen Description   Final    BLOOD LEFT ANTECUBITAL Performed at [facility], [address] [place], [place] [address]    Special Requests   Final    BOTTLES DRAWN AEROBIC AND ANAEROBIC Blood Culture adequate volume Performed at [facility], [address] [place], [place] [address]    Culture   Final    NO GROWTH 2 DAYS Performed at [facility], [address], [place], [place] [address]    Report Status PENDING  Incomplete  SARS Coronavirus 2 by RT PCR (hospital order, performed in [facility] hospital lab) Nasopharyngeal Nasopharyngeal Swab     Status: None   Collection Time: 12/21/19  2:20 PM   Specimen: Nasopharyngeal Swab  Result Value Ref Range Status   SARS Coronavirus 2 NEGATIVE NEGATIVE Final    Comment: (NOTE) SARS-CoV-2 target nucleic acids are NOT DETECTED.  The SARS-CoV-2 RNA is generally detectable in upper and lower respiratory specimens during the acute phase of infection. The lowest concentration of SARS-CoV-2 viral copies this assay can detect is 250 copies / mL. A negative result does not preclude SARS-CoV-2 infection and should not be used as the sole basis for treatment or other patient management decisions.  A negative result may occur with improper specimen collection / handling, submission of specimen other than nasopharyngeal swab, presence of viral mutation(s) within the areas targeted by this assay, and inadequate number of viral copies (<250 copies / mL). A negative result must be combined with clinical observations, patient history, and epidemiological information.  Fact Sheet for Patients:   BoilerBrush.com.cy  Fact Sheet for Healthcare Providers: https://pope.com/  This test is not yet approved or  cleared by the [place] FDA and has been authorized for detection  and/or diagnosis of SARS-CoV-2 by FDA under an Emergency Use Authorization (EUA).  This EUA will remain in effect (meaning this test can be used) for the duration of the COVID-19 declaration under Section 564(b)(1) of the Act, 21 U.S.C. section 360bbb-3(b)(1), unless the authorization is terminated or revoked sooner.  Performed at [facility] [facility], [address] [place], [place] [address]   Blood Culture ID Panel (Reflexed)     Status: Abnormal   Collection Time: 12/21/19  2:20 PM  Result Value Ref Range Status   Enterococcus faecalis NOT DETECTED NOT DETECTED Final   Enterococcus Faecium NOT DETECTED NOT DETECTED Final   Listeria monocytogenes NOT DETECTED NOT DETECTED Final   Staphylococcus species NOT DETECTED NOT DETECTED Final   Staphylococcus aureus (BCID) NOT DETECTED NOT DETECTED  Final   Staphylococcus epidermidis NOT DETECTED NOT DETECTED Final   Staphylococcus lugdunensis NOT DETECTED NOT DETECTED Final   Streptococcus species NOT DETECTED NOT DETECTED Final   Streptococcus agalactiae NOT DETECTED NOT DETECTED Final   Streptococcus pneumoniae NOT DETECTED NOT DETECTED Final   Streptococcus pyogenes NOT DETECTED NOT DETECTED Final   A.calcoaceticus-baumannii NOT DETECTED NOT DETECTED Final   Bacteroides fragilis DETECTED (A) NOT DETECTED Final    Comment: CRITICAL RESULT CALLED TO, READ BACK BY AND VERIFIED WITH: J. LEGGE PHARMD, AT 1310 12/23/19 BY D. VANHOOK    Enterobacterales NOT DETECTED NOT DETECTED Final   Enterobacter cloacae complex NOT DETECTED NOT DETECTED Final   Escherichia coli NOT DETECTED NOT DETECTED Final   Klebsiella aerogenes NOT DETECTED NOT DETECTED Final   Klebsiella oxytoca NOT DETECTED NOT DETECTED Final   Klebsiella pneumoniae NOT DETECTED NOT DETECTED Final   Proteus species NOT DETECTED NOT DETECTED Final   Salmonella species NOT DETECTED NOT DETECTED Final   Serratia marcescens NOT DETECTED NOT DETECTED Final   Haemophilus  influenzae NOT DETECTED NOT DETECTED Final   Neisseria meningitidis NOT DETECTED NOT DETECTED Final   Pseudomonas aeruginosa NOT DETECTED NOT DETECTED Final   Stenotrophomonas maltophilia NOT DETECTED NOT DETECTED Final   Candida albicans NOT DETECTED NOT DETECTED Final   Candida auris NOT DETECTED NOT DETECTED Final   Candida glabrata NOT DETECTED NOT DETECTED Final   Candida krusei NOT DETECTED NOT DETECTED Final   Candida parapsilosis NOT DETECTED NOT DETECTED Final   Candida tropicalis NOT DETECTED NOT DETECTED Final   Cryptococcus neoformans/gattii NOT DETECTED NOT DETECTED Final    Comment: Performed at [facility], [address], [place], [place] [address]    Radiographs and labs were personally reviewed by me.   Johny Sax, MD [facility] [phone] 12/23/2019, 3:39 PM

## 2019-12-23 NOTE — Progress Notes (Signed)
PHARMACY - PHYSICIAN COMMUNICATION CRITICAL VALUE ALERT - BLOOD CULTURE IDENTIFICATION (BCID)  Dillon Wolfe is an 49 y.o. male who presented to [facility] on 12/21/2019 with a chief complaint of chills and fatigue.  Assessment:  Possible perforated gallbladder s/p IR drainage 9/17  Name of physician (or Provider) Contacted: Dr. Cliffton Asters  Current antibiotics: Zosyn  Changes to prescribed antibiotics recommended:  Patient is on recommended antibiotics - No changes needed  Results for orders placed or performed during the hospital encounter of 12/21/19  Blood Culture ID Panel (Reflexed) (Collected: 12/21/2019  2:20 PM)  Result Value Ref Range   Enterococcus faecalis NOT DETECTED NOT DETECTED   Enterococcus Faecium NOT DETECTED NOT DETECTED   Listeria monocytogenes NOT DETECTED NOT DETECTED   Staphylococcus species NOT DETECTED NOT DETECTED   Staphylococcus aureus (BCID) NOT DETECTED NOT DETECTED   Staphylococcus epidermidis NOT DETECTED NOT DETECTED   Staphylococcus lugdunensis NOT DETECTED NOT DETECTED   Streptococcus species NOT DETECTED NOT DETECTED   Streptococcus agalactiae NOT DETECTED NOT DETECTED   Streptococcus pneumoniae NOT DETECTED NOT DETECTED   Streptococcus pyogenes NOT DETECTED NOT DETECTED   A.calcoaceticus-baumannii NOT DETECTED NOT DETECTED   Bacteroides fragilis DETECTED (A) NOT DETECTED   Enterobacterales NOT DETECTED NOT DETECTED   Enterobacter cloacae complex NOT DETECTED NOT DETECTED   Escherichia coli NOT DETECTED NOT DETECTED   Klebsiella aerogenes NOT DETECTED NOT DETECTED   Klebsiella oxytoca NOT DETECTED NOT DETECTED   Klebsiella pneumoniae NOT DETECTED NOT DETECTED   Proteus species NOT DETECTED NOT DETECTED   Salmonella species NOT DETECTED NOT DETECTED   Serratia marcescens NOT DETECTED NOT DETECTED   Haemophilus influenzae NOT DETECTED NOT DETECTED   Neisseria meningitidis NOT DETECTED NOT DETECTED   Pseudomonas aeruginosa NOT DETECTED NOT  DETECTED   Stenotrophomonas maltophilia NOT DETECTED NOT DETECTED   Candida albicans NOT DETECTED NOT DETECTED   Candida auris NOT DETECTED NOT DETECTED   Candida glabrata NOT DETECTED NOT DETECTED   Candida krusei NOT DETECTED NOT DETECTED   Candida parapsilosis NOT DETECTED NOT DETECTED   Candida tropicalis NOT DETECTED NOT DETECTED   Cryptococcus neoformans/gattii NOT DETECTED NOT DETECTED    Loralee Pacas, PharmD, BCPS Pharmacy: [phone] 12/23/2019  1:39 PM

## 2019-12-23 NOTE — Procedures (Signed)
Interventional Radiology Procedure Note  Procedure: CT guided right abdominal fluid collection aspiration and drainage  Findings: Please refer to procedural dictation for full description.  Purulent material aspirated, sample sent for culture.  CT findings at time of procedure demonstrate communication with appendix, favoring perforated appendicitis with appendicolith in fluid collection.  Placement of 12 Fr drain.  Complications: None immediate  Estimated Blood Loss: <10 mL  Recommendations: Keep to bag drainage. Follow up cultures. Long term plan in concert with Surgery.   Marliss Coots, MD

## 2019-12-24 ENCOUNTER — Inpatient Hospital Stay (HOSPITAL_COMMUNITY): Payer: Medicaid Other

## 2019-12-24 LAB — COMPREHENSIVE METABOLIC PANEL
ALT: 128 U/L — ABNORMAL HIGH (ref 0–44)
AST: 105 U/L — ABNORMAL HIGH (ref 15–41)
Albumin: 1.8 g/dL — ABNORMAL LOW (ref 3.5–5.0)
Alkaline Phosphatase: 80 U/L (ref 38–126)
Anion gap: 12 (ref 5–15)
BUN: 67 mg/dL — ABNORMAL HIGH (ref 6–20)
CO2: 17 mmol/L — ABNORMAL LOW (ref 22–32)
Calcium: 8.2 mg/dL — ABNORMAL LOW (ref 8.9–10.3)
Chloride: 104 mmol/L (ref 98–111)
Creatinine, Ser: 5.78 mg/dL — ABNORMAL HIGH (ref 0.61–1.24)
GFR calc Af Amer: 12 mL/min — ABNORMAL LOW (ref >60)
GFR calc non Af Amer: 10 mL/min — ABNORMAL LOW (ref >60)
Glucose, Bld: 159 mg/dL — ABNORMAL HIGH (ref 70–99)
Potassium: 4.2 mmol/L (ref 3.5–5.1)
Sodium: 133 mmol/L — ABNORMAL LOW (ref 135–145)
Total Bilirubin: 1.6 mg/dL — ABNORMAL HIGH (ref 0.3–1.2)
Total Protein: 6.8 g/dL (ref 6.5–8.1)

## 2019-12-24 LAB — CBC WITH DIFFERENTIAL/PLATELET
Abs Immature Granulocytes: 0.9 10*3/uL — ABNORMAL HIGH (ref 0.00–0.07)
Basophils Absolute: 0.1 10*3/uL (ref 0.0–0.1)
Basophils Relative: 0 %
Eosinophils Absolute: 0 10*3/uL (ref 0.0–0.5)
Eosinophils Relative: 0 %
HCT: 26.8 % — ABNORMAL LOW (ref 39.0–52.0)
Hemoglobin: 8.9 g/dL — ABNORMAL LOW (ref 13.0–17.0)
Immature Granulocytes: 4 %
Lymphocytes Relative: 7 %
Lymphs Abs: 1.5 10*3/uL (ref 0.7–4.0)
MCH: 25.4 pg — ABNORMAL LOW (ref 26.0–34.0)
MCHC: 33.2 g/dL (ref 30.0–36.0)
MCV: 76.4 fL — ABNORMAL LOW (ref 80.0–100.0)
Monocytes Absolute: 1.2 10*3/uL — ABNORMAL HIGH (ref 0.1–1.0)
Monocytes Relative: 5 %
Neutro Abs: 19 10*3/uL — ABNORMAL HIGH (ref 1.7–7.7)
Neutrophils Relative %: 84 %
Platelets: 164 10*3/uL (ref 150–400)
RBC: 3.51 MIL/uL — ABNORMAL LOW (ref 4.22–5.81)
RDW: 15 % (ref 11.5–15.5)
WBC: 22.7 10*3/uL — ABNORMAL HIGH (ref 4.0–10.5)
nRBC: 0 % (ref 0.0–0.2)

## 2019-12-24 LAB — SODIUM, URINE, RANDOM: Sodium, Ur: 36 mmol/L

## 2019-12-24 LAB — URINALYSIS, ROUTINE W REFLEX MICROSCOPIC
Bilirubin Urine: NEGATIVE
Glucose, UA: NEGATIVE mg/dL
Ketones, ur: NEGATIVE mg/dL
Leukocytes,Ua: NEGATIVE
Nitrite: NEGATIVE
Protein, ur: 30 mg/dL — AB
Specific Gravity, Urine: 1.012 (ref 1.005–1.030)
pH: 5 (ref 5.0–8.0)

## 2019-12-24 LAB — HEMOGLOBIN A1C
Hgb A1c MFr Bld: 7.6 % — ABNORMAL HIGH (ref 4.8–5.6)
Mean Plasma Glucose: 171.42 mg/dL

## 2019-12-24 LAB — CREATININE, URINE, RANDOM: Creatinine, Urine: 98.73 mg/dL

## 2019-12-24 MED ORDER — PIPERACILLIN-TAZOBACTAM IN DEX 2-0.25 GM/50ML IV SOLN
2.2500 g | Freq: Three times a day (TID) | INTRAVENOUS | Status: DC
  Filled 2019-12-24: qty 50

## 2019-12-24 MED ORDER — PIPERACILLIN-TAZOBACTAM IN DEX 2-0.25 GM/50ML IV SOLN
2.2500 g | Freq: Four times a day (QID) | INTRAVENOUS | Status: DC
  Administered 2019-12-24 – 2019-12-26 (×10): 2.25 g via INTRAVENOUS
  Filled 2019-12-24 (×11): qty 50

## 2019-12-24 MED ORDER — SODIUM BICARBONATE 650 MG PO TABS
1300.0000 mg | ORAL_TABLET | Freq: Two times a day (BID) | ORAL | Status: DC
  Administered 2019-12-24 (×2): 1300 mg via ORAL
  Filled 2019-12-24 (×3): qty 2

## 2019-12-24 MED ORDER — ALBUMIN HUMAN 5 % IV SOLN
50.0000 g | Freq: Once | INTRAVENOUS | Status: AC
  Administered 2019-12-24: 50 g via INTRAVENOUS
  Filled 2019-12-24: qty 1000

## 2019-12-24 NOTE — Progress Notes (Deleted)
Nephrology Consult   Requesting provider: Harriette Wolfe Service requesting consult: Surgery Reason for consult: AKI   Assessment/Recommendations: Dillon Wolfe is a/an 49 y.o. male with no pmh who present w/ liver abscess and AKI   Non-Oliguric AKI: Likely secondary to ATN from hypotension as well as possible intermittent dehydration and contrast-induced injury.  Continue some NSAIDs at home which potentially contributed.  Possible AIN or infectious and associated GN but much less likely.  Difficult to say if the patient has chronic kidney disease or not.  He does not follow with outpatient providers.  Given his low albumin we will try to improve intravascular fluid status and continue to monitor his kidney function. -Albumin 50 g of 5%,, redose as needed -Follow-up urinalysis and renal ultrasound -PTH to possibly guide whether CKD is present -Continue to monitor daily Cr, Dose meds for GFR -Monitor Daily I/Os, Daily weight  -Maintain MAP>65 for optimal renal perfusion.  -Avoid nephrotoxic medications including NSAIDs and Vanc/Zosyn combo -Currently no indication for HD but continue to monitor closely  Volume Status: Appears euvolemic on my exam but likely intravascularly depleted.  Albumin as above  Hypotension: Relative.  Intravascular repletion as above.  Consider pressors or midodrine to maintain MAP greater than 65.  Liver abscess: Possibly from gallbladder ulceration.  Currently on Zosyn monotherapy.  Significant fluctuating leukocytosis.  Infectious disease and general surgery managing.  Anemia: Hemoglobin 8.9 decreased from 12.1.  Continue to monitor.  Kidney disease likely contributing minimally.  Hyperglycemia: Noted intermittently.  Hemoglobin A1c added on  Hypoalbuminemia: 1.8 today.  Likely negative acute phase reactant.  Providing repletion as above.  Hyponatremia: Sodium 133 today.  Likely related to free water retention in the setting of AKI.  Continue to  monitor  NAGMA: Bicarb 17 today.  Start sodium bicarb 1300 mg twice daily.   Recommendations conveyed to primary service.    [facility] 12/24/2019 11:12 AM   _____________________________________________________________________________________ CC: AKI  History of Present Illness: Dillon Wolfe is a/an 49 y.o. male with no known past medical history who presents with fevers and chills..  Patient initially presented to the hospital on 9/15 with fevers and chills.  He had been noticing these fevers and chills for a couple weeks which is limited his ability to perform daily tasks.  He was also having intermittent right-sided back pain.  He also had noticed intermittent sweating as well as being thirsty.  He also has had significant weight loss due to poor p.o. intake and intermittent diarrhea.  He has had some emesis and nausea which improved since hospitalization.  He denies any history with his kidneys in the past adamantly stating "there is nothing wrong with my kidneys."  He has not had dysuria and denies any history of hematuria.  He does use NSAIDs intermittently.  On admission he was found to have a large posterior liver abscess and it was thought this might be coming from his gallbladder due to ulceration.  The patient underwent drain placement on 9/16.  Infectious disease was involved and helped with antibiotic management.  The patient's creatinine was 1.7 on arrival and again 1.7 on 9/16.  The patient then had a repeat creatinine of 5.8 on 9/18.  The patient has some ongoing hypotension and also received contrast on 9/15.  He denies any difficulties urinating or changes to his urinary habits.  He denies confusion.  He continues to have intermittent belly pain.   Medications:  Current Facility-Administered Medications  Medication Dose Route  Frequency Provider Last Rate Last Admin  . acetaminophen (TYLENOL) tablet 1,000 mg  1,000 mg Oral Q6H PRN  Dillon Levee, MD      . albumin human 5 % solution 50 g  50 g Intravenous Once Dillon Level, MD      . Chlorhexidine Gluconate Cloth 2 % PADS 6 each  6 each Topical Daily Drema Dallas, MD      . dextrose 5 % and 0.45 % NaCl with KCl 20 mEq/L infusion   Intravenous Continuous Dillon Levee, MD 125 mL/hr at 12/24/19 0428 New Bag at 12/24/19 0428  . diphenhydrAMINE (BENADRYL) capsule 25 mg  25 mg Oral Q6H PRN Dillon Wolfe, Dillon Wolfe, Dillon Wolfe       Or  . diphenhydrAMINE (BENADRYL) injection 25 mg  25 mg Intravenous Q6H PRN Dillon Wolfe, Dillon Wolfe, Dillon Wolfe      . docusate sodium (COLACE) capsule 100 mg  100 mg Oral BID Dillon Levee, MD   100 mg at 12/23/19 2043  . HYDROcodone-acetaminophen (NORCO/VICODIN) 5-325 MG per tablet 1-2 tablet  1-2 tablet Oral Q6H PRN Dillon Levee, MD      . melatonin tablet 3 mg  3 mg Oral QHS Dillon Halim, Dillon Wolfe   3 mg at 12/23/19 2042  . morphine 2 MG/ML injection 2 mg  2 mg Intravenous Q2H PRN Dillon Levee, MD      . ondansetron (ZOFRAN-ODT) disintegrating tablet 4 mg  4 mg Oral Q6H PRN Dillon Levee, MD       Or  . ondansetron [facility]) injection 4 mg  4 mg Intravenous Q6H PRN Dillon Levee, MD      . piperacillin-tazobactam (ZOSYN) IVPB 2.25 g  2.25 g Intravenous Q6H Phylliss Blakes, RPH 100 mL/hr at 12/24/19 0533 2.25 g at 12/24/19 0533  . protein supplement (ENSURE MAX) liquid  11 oz Oral Daily Andria Meuse, MD   11 oz at 12/23/19 1403  . simethicone (MYLICON) chewable tablet 40 mg  40 mg Oral Q6H PRN Dillon Levee, MD      . sodium chloride flush (NS) 0.9 % injection 10-40 mL  10-40 mL Intracatheter PRN Drema Dallas, MD      . sodium chloride flush (NS) 0.9 % injection 5 mL  5 mL Intracatheter Q8H Suttle, Thressa Sheller, MD   5 mL at 12/24/19 0534     ALLERGIES Patient has no known allergies.  MEDICAL HISTORY History reviewed. No pertinent past medical history.   SOCIAL HISTORY Social History   Socioeconomic History  . Marital status: Divorced     Spouse name: Not on file  . Number of children: Not on file  . Years of education: Not on file  . Highest education level: Not on file  Occupational History  . Not on file  Tobacco Use  . Smoking status: Light Tobacco Smoker    Types: Cigars  . Smokeless tobacco: Never Used  . Tobacco comment: black and milds when drinking  Vaping Use  . Vaping Use: Never used  Substance and Sexual Activity  . Alcohol use: Not Currently  . Drug use: No  . Sexual activity: Not on file  Other Topics Concern  . Not on file  Social History Narrative  . Not on file   Social Determinants of Health   Financial Resource Strain:   . Difficulty of Paying Living Expenses: Not on file  Food Insecurity:   . Worried About Programme researcher, broadcasting/film/video in the Last Year: Not on file  .  Ran Out of Food in the Last Year: Not on file  Transportation Needs:   . Lack of Transportation (Medical): Not on file  . Lack of Transportation (Non-Medical): Not on file  Physical Activity:   . Days of Exercise per Week: Not on file  . Minutes of Exercise per Session: Not on file  Stress:   . Feeling of Stress : Not on file  Social Connections:   . Frequency of Communication with Friends and Family: Not on file  . Frequency of Social Gatherings with Friends and Family: Not on file  . Attends Religious Services: Not on file  . Active Member of Clubs or Organizations: Not on file  . Attends Banker Meetings: Not on file  . Marital Status: Not on file  Intimate Partner Violence:   . Fear of Current or Ex-Partner: Not on file  . Emotionally Abused: Not on file  . Physically Abused: Not on file  . Sexually Abused: Not on file     FAMILY HISTORY Family History  Problem Relation Age of Onset  . Obesity Mother   . Heart failure Mother   . Hypertension Mother   . Cancer Father       Review of Systems: 12 systems reviewed Otherwise as per HPI, all other systems reviewed and negative  Physical  Exam: Vitals:   12/23/19 2049 12/24/19 0438  BP: 92/70 98/71  Pulse: 81 77  Resp: 18 18  Temp:  97.7 F (36.5 C)  SpO2: 99% 99%   Total I/O In: 120 [P.O.:120] Out: 0   Intake/Output Summary (Last 24 hours) at 12/24/2019 1112 Last data filed at 12/24/2019 1015 Gross per 24 hour  Intake 1441.27 ml  Output 1110 ml  Net 331.27 ml   General: well-appearing, no acute distress HEENT: No nasal discharge, moist mucous membranes CV: Normal rate, no murmurs, no edema Lungs: clear to auscultation bilaterally, normal work of breathing Abd: soft, mild tenderness to palpation on the right side, non-distended Skin: no visible lesions or rashes Psych: alert, engaged, appropriate mood and affect Musculoskeletal: no obvious deformities Neuro: normal speech, no gross focal deficits   Test Results Reviewed Lab Results  Component Value Date   NA 133 (L) 12/24/2019   K 4.2 12/24/2019   CL 104 12/24/2019   CO2 17 (L) 12/24/2019   BUN 67 (H) 12/24/2019   CREATININE 5.78 (H) 12/24/2019   CALCIUM 8.2 (L) 12/24/2019   ALBUMIN 1.8 (L) 12/24/2019     I have reviewed all relevant outside healthcare records related to the patient's current hospitalization

## 2019-12-24 NOTE — Progress Notes (Signed)
Subjective/Chief Complaint: Pt feels ok this am    Objective: Vital signs in last 24 hours: Temp:  [97.7 F (36.5 C)-98.2 F (36.8 C)] 97.7 F (36.5 C) (09/18 0438) Pulse Rate:  [77-87] 77 (09/18 0438) Resp:  [16-30] 18 (09/18 0438) BP: (88-101)/(63-79) 98/71 (09/18 0438) SpO2:  [98 %-100 %] 99 % (09/18 0438) Last BM Date: 12/23/19  Intake/Output from previous day: 09/17 0701 - 09/18 0700 In: 1711 [P.O.:720; I.V.:780.5; IV Piggyback:205.5] Out: 1110 [Drains:1110] Intake/Output this shift: No intake/output data recorded.  General appearance: alert and cooperative Resp: clear to auscultation bilaterally Cardio: regular rate and rhythm, S1, S2 normal, no murmur, click, rub or gallop GI: Perc drian RUQ noted  brown drainage  Neurologic: Grossly normal  Lab Results:  Recent Labs    12/22/19 0310 12/24/19 0329  WBC 7.9 22.7*  HGB 12.1* 8.9*  HCT 39.0 26.8*  PLT 237 164   BMET Recent Labs    12/22/19 0310 12/24/19 0329  NA 135 133*  K 3.9 4.2  CL 100 104  CO2 14* 17*  GLUCOSE 128* 159*  BUN 25* 67*  CREATININE 1.67* 5.78*  CALCIUM 8.6* 8.2*   PT/INR Recent Labs    12/21/19 1420  LABPROT 15.3*  INR 1.3*   ABG No results for input(s): PHART, HCO3 in the last 72 hours.  Invalid input(s): PCO2, PO2  Studies/Results: CT IMAGE GUIDED DRAINAGE BY PERCUTANEOUS CATHETER  Result Date: 12/23/2019 INDICATION: 49 year old male with intra-abdominal/intrahepatic fluid collection concerning for abscess. EXAM: CT-guided drain placement MEDICATIONS: The patient is currently admitted to the hospital and receiving intravenous antibiotics. The antibiotics were administered within an appropriate time frame prior to the initiation of the procedure. ANESTHESIA/SEDATION: Fentanyl 50 mcg IV; Versed 2 mg IV Moderate Sedation Time:  22 The patient was continuously monitored during the procedure by the interventional radiology nurse under my direct supervision. COMPLICATIONS:  None immediate. PROCEDURE: Informed written consent was obtained from the patient after a thorough discussion of the procedural risks, benefits and alternatives. All questions were addressed. Maximal Sterile Barrier Technique was utilized including caps, mask, sterile gowns, sterile gloves, sterile drape, hand hygiene and skin antiseptic. A timeout was performed prior to the initiation of the procedure. The patient's right upper quadrant was prepped and draped in standard fashion. Planning CT demonstrated similar appearance of previously visualized intrahepatic multi loculated fluid collection containing an approximately 1.3 cm radiopaque calculus in the periphery. There are scattered air-fluid levels. The procedure was planned. A small amount of local anesthesia was provided at the skin entry site with 1% lidocaine with epinephrine. Deeper local anesthesia was provided along the planned drain entry path. An 18 gauge trocar needle was advanced under intermittent CT fluoroscopy into the fluid collection. Immediately, there was peroneal aunt, foul-smelling aspirate from the initial needle placement. An Amplatz wire was coiled within the fluid collection. Serial dilation was performed with 8 and 12 [place] dilators. A 12 French pigtail drainage catheter was advanced into the fluid collection in the pigtail portion with locked. Approximately 200 mL of purulence aspirate were removed and the drain was then placed to bag drainage. The samples were sent to the lab for culture. The patient tolerated the procedure well without immediate complication. IMPRESSION: Successful CT-guided abdominal/intrahepatic drain placement. Preprocedure CT demonstrates fluid collection originating at the tip of the appendix. Calculus within the fluid collection likely represents appendicoliths. The gallbladder appears minimally inflamed, likely reactive, but intact and separate from the fluid collection, suggesting etiology of fluid collection  is secondary to perforated appendicitis. Electronically Signed   By: Marliss Coots MD   On: 12/23/2019 13:02    Anti-infectives: Anti-infectives (From admission, onward)   Start     Dose/Rate Route Frequency Ordered Stop   12/24/19 0600  piperacillin-tazobactam (ZOSYN) IVPB 2.25 g  Status:  Discontinued        2.25 g 100 mL/hr over 30 Minutes Intravenous Every 8 hours 12/24/19 0522 12/24/19 0524   12/24/19 0600  piperacillin-tazobactam (ZOSYN) IVPB 2.25 g        2.25 g 100 mL/hr over 30 Minutes Intravenous Every 6 hours 12/24/19 0524     12/21/19 2200  piperacillin-tazobactam (ZOSYN) IVPB 3.375 g  Status:  Discontinued        3.375 g 12.5 mL/hr over 240 Minutes Intravenous Every 8 hours 12/21/19 2110 12/24/19 0522   12/21/19 1430  piperacillin-tazobactam (ZOSYN) IVPB 3.375 g        3.375 g 100 mL/hr over 30 Minutes Intravenous  Once 12/21/19 1418 12/21/19 1527      Assessment/Plan:    Liver abscess 12/21/2019   Possibly perforated gallbladder based on what appears to be intraperitoneal gallstone; total bilirubin elevated  -IR  drainage -Repeat CMP, monitor drain outputs -Ok for diet following - regular -Continue broad spec IV abx -SCDs; RESTART LOVANOX  ARF- Cr over 5-  Consult nephrology - More than like a contrast issue pt made aware  IVF for now and monitor UOP and labs      LOS: 3 days    Dortha Schwalbe MD  12/24/2019

## 2019-12-24 NOTE — Progress Notes (Signed)
PHARMACY NOTE:  ANTIMICROBIAL RENAL DOSAGE ADJUSTMENT  Current antimicrobial regimen includes a mismatch between antimicrobial dosage and estimated renal function.  As per policy approved by the Pharmacy & Therapeutics and Medical Executive Committees, the antimicrobial dosage will be adjusted accordingly.  Current antimicrobial dosage:  Zosyn 3.375gm IV Q8h to be infused over 4hrs   Indication: B fragilis bacteremia  Renal Function:  Estimated Creatinine Clearance: 17.9 mL/min (A) (by C-G formula based on SCr of 5.78 mg/dL (H)). []      On intermittent HD, scheduled: []      On CRRT    Antimicrobial dosage has been changed to: Zosyn 2.25gm IV q6h  Additional comments:   Thank you for allowing pharmacy to be a part of this patient's care.  , [facility] 12/24/2019 5:23 AM

## 2019-12-24 NOTE — Progress Notes (Signed)
Referring Physician(s): Sherrie George ([facility])  Supervising Physician: Simonne Come  Patient Status:  [facility] - In-pt  Chief Complaint: "Pain at drain"  Subjective:  History of intra-abdominal/intra-hepatic fluid collection concerning for abscess s/p RUQ/liver drain placement in IR 12/23/2019. Patient awake and alert laying in bed. Complains of tenderness of RUQ/liver drain insertion site, as expected. RUQ/liver drain site c/d/i.   Allergies: Patient has no known allergies.  Medications: Prior to Admission medications   Medication Sig Start Date End Date Taking? Authorizing Provider  ibuprofen (ADVIL,MOTRIN) 800 MG tablet Take 1 tablet (800 mg total) by mouth 3 (three) times daily. Patient taking differently: Take 800 mg by mouth in the morning.  09/24/17  Yes Melene Plan, DO  HYDROcodone-acetaminophen (NORCO/VICODIN) 5-325 MG per tablet 1-2 tablets po q 6 hours prn moderate to severe pain Patient not taking: Reported on 12/21/2019 03/01/12   Quita Skye, MD     Vital Signs: BP 98/71 (BP Location: Left Arm)   Pulse 77   Temp 97.7 F (36.5 C) (Oral)   Resp 18   Ht 5\' 8"  (1.727 m)   Wt 230 lb (104.3 kg)   SpO2 99%   BMI 34.97 kg/m   Physical Exam Vitals and nursing note reviewed.  Constitutional:      General: He is not in acute distress. Pulmonary:     Effort: Pulmonary effort is normal. No respiratory distress.  Abdominal:     Comments: RUQ/liver drain site with mild tenderness, no erythema, drainage, or active bleeding; approximately 50-75 cc purulent drainage in gravity bag.  Skin:    General: Skin is warm and dry.  Neurological:     Mental Status: He is alert and oriented to person, place, and time.     Imaging: CT Abdomen Pelvis W Contrast  Result Date: 12/21/2019 CLINICAL DATA:  Abdominal pain and fever, possibly diabetic EXAM: CT ABDOMEN AND PELVIS WITH CONTRAST TECHNIQUE: Multidetector CT imaging of the abdomen and pelvis was performed using the  standard protocol following bolus administration of intravenous contrast. CONTRAST:  12/23/2019 OMNIPAQUE IOHEXOL 300 MG/ML  SOLN COMPARISON:  None FINDINGS: Lower chest: Atelectatic changes are present in the lung bases. Underlying consolidation not fully excluded. Borderline enlarged 12 mm lower mediastinal lymph node (2/19) likely reactive. Normal heart size. Trace pericardial effusion. Hepatobiliary: There is heterogeneous air and fluid containing rim enhancing collection/abscess extending across the tip of the right lobe liver with evidence of direct intrahepatic extension (4/104). Conglomerate size of the extrahepatic component is approximately 16.7 x 10.5 x 7.5 cm although multiloculated collections within the liver parenchyma measure up to 7.1 x 5.5 x 8.4 cm. The gallbladder is largely decompressed with mural thickening and pericholecystic fluid but without wall discontinuity to suggest gallbladder perforation. No biliary ductal dilatation. Pancreas: Unremarkable. No pancreatic ductal dilatation or surrounding inflammatory changes. Spleen: Normal in size. No concerning splenic lesions. Adrenals/Urinary Tract: Normal adrenal glands. Kidneys enhance uniformly and symmetrically with normal excretion. No concerning renal mass, urolithiasis or hydronephrosis. Urinary bladder is unremarkable aside from some mild indentation of the bladder base by an enlarged prostate. Stomach/Bowel: Distal esophagus, stomach and duodenum are unremarkable. Minimal small bowel thickening in the right upper quadrant is likely reactive. A thick-walled, mucosally appendix courses from the tip of the cecum, terminating along the inferior extent of the large heterogeneous collection in the right upper quadrant and along the inferior liver tip, as detailed above. Within this collection is a 1 cm calculus layering dependently (2/38). There is additional circumferential  mural thickening along the ascending and hepatic flexure. More distal colon  has a normal appearance. Vascular/Lymphatic: A reactive adenopathy seen in the right upper quadrant. No pathologically enlarged nodes seen in the abdomen or pelvis. No significant vascular findings. Hepatic and portal veins remain patent as imaged. Reproductive: Mild indentation of bladder base by a borderline enlarged prostate. No concerning prostate lesions. Other: Heterogeneous air and fluid containing rim enhancing collection in the right upper quadrant with intrahepatic extension, as detailed above. This collection is intimate both with the superior margin of the hepatic flexure as well as the tip of the appendix and contains a small calcific radiodensity which could reflect a fecalith. Extensive surrounding phlegmon is noted. No free intraperitoneal air is seen however. No bowel containing hernias Musculoskeletal: No acute osseous abnormality or suspicious osseous lesion. Minimal degenerative changes in the spine. Sclerotic lucent lesion in the left femoral neck, likely synovial pit. IMPRESSION: 1. Large heterogeneous air and fluid containing rim enhancing collection/abscess extending across the tip of the right lobe liver with evidence of direct intrahepatic extension. This collection is intimate both with the superior margin of the thickened hepatic flexure as well as the tip of a mucosally hyperenhancing appendix, contains a small calcific radiodensity which could reflect a fecalith. This may represent the sequela of a perforated appendicitis or diverticulitis. Extensive surrounding phlegmon is noted as well as likely reactive thickening and inflammatory changes of the gallbladder. 2. No free intraperitoneal air is seen elsewhere. 3. No adjacent traumatic findings of the abdominal wall or liver parenchyma. 4. Atelectatic changes in the lung bases. 5. Trace pericardial effusion. 6. Aortic Atherosclerosis (ICD10-I70.0). These results were called by telephone at the time of interpretation on 12/21/2019 at 4:48  pm to provider Dr. Rubin Payor, who verbally acknowledged these results. Electronically Signed   By: Kreg Shropshire M.D.   On: 12/21/2019 16:51   US RENAL  Result Date: 12/24/2019 CLINICAL DATA:  Acute renal insufficiency. EXAM: RENAL / URINARY TRACT ULTRASOUND COMPLETE COMPARISON:  CT 12/21/2019 FINDINGS: Right Kidney: Renal measurements: 11.7 x 6.2 x 6.1 cm = volume: 230 mL. Echogenicity within normal limits. No mass or hydronephrosis visualized. Left Kidney: Renal measurements: 12.5 x 6.3 x 6.0 cm = volume: 249 mL. Echogenicity within normal limits. No mass or hydronephrosis visualized. Bladder: Appears normal for degree of bladder distention. Bilateral ureteral jets visualized. Other: None. IMPRESSION: Normal size kidneys without hydronephrosis. Electronically Signed   By: Elberta Fortis M.D.   On: 12/24/2019 12:31   DG Chest Port 1 View  Result Date: 12/21/2019 CLINICAL DATA:  Sepsis EXAM: PORTABLE CHEST 1 VIEW COMPARISON:  None. FINDINGS: There is elevation of the right hemidiaphragm. The lungs are clear. No pneumothorax or pleural effusion. Cardiac size within normal limits. Pulmonary vascularity is normal. No acute bone abnormality. IMPRESSION: Elevation of the right hemidiaphragm.  No active disease. Electronically Signed   By: Helyn Numbers MD   On: 12/21/2019 15:06   CT IMAGE GUIDED DRAINAGE BY PERCUTANEOUS CATHETER  Result Date: 12/23/2019 INDICATION: 49 year old male with intra-abdominal/intrahepatic fluid collection concerning for abscess. EXAM: CT-guided drain placement MEDICATIONS: The patient is currently admitted to the [facility] and receiving intravenous antibiotics. The antibiotics were administered within an appropriate time frame prior to the initiation of the procedure. ANESTHESIA/SEDATION: Fentanyl 50 mcg IV; Versed 2 mg IV Moderate Sedation Time:  22 The patient was continuously monitored during the procedure by the interventional radiology nurse under my direct supervision.  COMPLICATIONS: None immediate. PROCEDURE: Informed written consent was obtained  from the patient after a thorough discussion of the procedural risks, benefits and alternatives. All questions were addressed. Maximal Sterile Barrier Technique was utilized including caps, mask, sterile gowns, sterile gloves, sterile drape, hand hygiene and skin antiseptic. A timeout was performed prior to the initiation of the procedure. The patient's right upper quadrant was prepped and draped in standard fashion. Planning CT demonstrated similar appearance of previously visualized intrahepatic multi loculated fluid collection containing an approximately 1.3 cm radiopaque calculus in the periphery. There are scattered air-fluid levels. The procedure was planned. A small amount of local anesthesia was provided at the skin entry site with 1% lidocaine with epinephrine. Deeper local anesthesia was provided along the planned drain entry path. An 18 gauge trocar needle was advanced under intermittent CT fluoroscopy into the fluid collection. Immediately, there was peroneal aunt, foul-smelling aspirate from the initial needle placement. An Amplatz wire was coiled within the fluid collection. Serial dilation was performed with 8 and 12 [place] dilators. A 12 French pigtail drainage catheter was advanced into the fluid collection in the pigtail portion with locked. Approximately 200 mL of purulence aspirate were removed and the drain was then placed to bag drainage. The samples were sent to the lab for culture. The patient tolerated the procedure well without immediate complication. IMPRESSION: Successful CT-guided abdominal/intrahepatic drain placement. Preprocedure CT demonstrates fluid collection originating at the tip of the appendix. Calculus within the fluid collection likely represents appendicoliths. The gallbladder appears minimally inflamed, likely reactive, but intact and separate from the fluid collection, suggesting etiology of  fluid collection is secondary to perforated appendicitis. Electronically Signed   By: Marliss Coots MD   On: 12/23/2019 13:02    Labs:  CBC: Recent Labs    12/21/19 1300 12/22/19 0310 12/24/19 0329  WBC 22.2* 7.9 22.7*  HGB 11.1* 12.1* 8.9*  HCT 32.7* 39.0 26.8*  PLT 289 237 164    COAGS: Recent Labs    12/21/19 1420  INR 1.3*  APTT 45*    BMP: Recent Labs    12/21/19 1300 12/22/19 0310 12/24/19 0329  NA 131* 135 133*  K 4.6 3.9 4.2  CL 95* 100 104  CO2 21* 14* 17*  GLUCOSE 179* 128* 159*  BUN 24* 25* 67*  CALCIUM 8.8* 8.6* 8.2*  CREATININE 1.71* 1.67* 5.78*  GFRNONAA 46* 47* 10*  GFRAA 53* 54* 12*    LIVER FUNCTION TESTS: Recent Labs    12/21/19 1300 12/24/19 0329  BILITOT 2.2* 1.6*  AST 79* 105*  ALT 75* 128*  ALKPHOS 111 80  PROT 8.6* 6.8  ALBUMIN 2.5* 1.8*    Assessment and Plan:  History of intra-abdominal/intra-hepatic fluid collection concerning for abscess s/p RUQ/liver drain placement in IR 12/23/2019. RUQ/liver drain stable with approximately 50-75 cc purulent drainage in gravity bag (additional 1,110 cc output from drain in past 24 hours). Continue current drain management- continue with Qshift flushes/monitor of output. Plan for repeat CT/possible drain injection when output <10 cc/day (assess for possible removal). Further plans per [facility]/nephrology- appreciate and agree with management. IR to follow.   Electronically Signed: Elwin Mocha, PA-C 12/24/2019, 1:04 PM   I spent a total of 25 Minutes at the the patient's bedside AND on the patient's [facility] floor or unit, greater than 50% of which was counseling/coordinating care for liver abscess s/p drain placement.

## 2019-12-25 LAB — CBC
HCT: 25.6 % — ABNORMAL LOW (ref 39.0–52.0)
Hemoglobin: 8.7 g/dL — ABNORMAL LOW (ref 13.0–17.0)
MCH: 25.8 pg — ABNORMAL LOW (ref 26.0–34.0)
MCHC: 34 g/dL (ref 30.0–36.0)
MCV: 76 fL — ABNORMAL LOW (ref 80.0–100.0)
Platelets: 185 10*3/uL (ref 150–400)
RBC: 3.37 MIL/uL — ABNORMAL LOW (ref 4.22–5.81)
RDW: 15.2 % (ref 11.5–15.5)
WBC: 15.5 10*3/uL — ABNORMAL HIGH (ref 4.0–10.5)
nRBC: 0 % (ref 0.0–0.2)

## 2019-12-25 LAB — COMPREHENSIVE METABOLIC PANEL
ALT: 89 U/L — ABNORMAL HIGH (ref 0–44)
AST: 62 U/L — ABNORMAL HIGH (ref 15–41)
Albumin: 2.1 g/dL — ABNORMAL LOW (ref 3.5–5.0)
Alkaline Phosphatase: 64 U/L (ref 38–126)
Anion gap: 11 (ref 5–15)
BUN: 68 mg/dL — ABNORMAL HIGH (ref 6–20)
CO2: 17 mmol/L — ABNORMAL LOW (ref 22–32)
Calcium: 7.9 mg/dL — ABNORMAL LOW (ref 8.9–10.3)
Chloride: 104 mmol/L (ref 98–111)
Creatinine, Ser: 6.25 mg/dL — ABNORMAL HIGH (ref 0.61–1.24)
GFR calc Af Amer: 11 mL/min — ABNORMAL LOW (ref >60)
GFR calc non Af Amer: 10 mL/min — ABNORMAL LOW (ref >60)
Glucose, Bld: 119 mg/dL — ABNORMAL HIGH (ref 70–99)
Potassium: 4.1 mmol/L (ref 3.5–5.1)
Sodium: 132 mmol/L — ABNORMAL LOW (ref 135–145)
Total Bilirubin: 1.1 mg/dL (ref 0.3–1.2)
Total Protein: 6.3 g/dL — ABNORMAL LOW (ref 6.5–8.1)

## 2019-12-25 LAB — CULTURE, BLOOD (ROUTINE X 2): Special Requests: ADEQUATE

## 2019-12-25 MED ORDER — SODIUM BICARBONATE 650 MG PO TABS
1300.0000 mg | ORAL_TABLET | Freq: Three times a day (TID) | ORAL | Status: DC
  Administered 2019-12-25 – 2019-12-29 (×14): 1300 mg via ORAL
  Filled 2019-12-25 (×16): qty 2

## 2019-12-25 MED ORDER — ALBUMIN HUMAN 5 % IV SOLN
50.0000 g | Freq: Once | INTRAVENOUS | Status: AC
  Administered 2019-12-25: 50 g via INTRAVENOUS
  Filled 2019-12-25: qty 1000

## 2019-12-25 NOTE — Consult Note (Signed)
Nephrology Consult   Requesting provider: Harriette Bouillon Service requesting consult: Surgery Reason for consult: AKI   Assessment/Recommendations: [facility] is a/an 49 y.o. male with no pmh who present w/ liver abscess and AKI   Non-Oliguric AKI: Likely secondary to ATN from hypotension as well as possible intermittent dehydration and contrast-induced injury.  Continue some NSAIDs at home which potentially contributed.  Possible AIN or infectious and associated GN but much less likely.  Difficult to say if the patient has chronic kidney disease or not.  He does not follow with outpatient providers.  Given his low albumin we will try to improve intravascular fluid status and continue to monitor his kidney function. -Albumin 50 g of 5%,, redose as needed -Follow-up urinalysis and renal ultrasound -PTH to possibly guide whether CKD is present -Continue to monitor daily Cr, Dose meds for GFR -Monitor Daily I/Os, Daily weight  -Maintain MAP>65 for optimal renal perfusion.  -Avoid nephrotoxic medications including NSAIDs and Vanc/Zosyn combo -Currently no indication for HD but continue to monitor closely  Volume Status: Appears euvolemic on my exam but likely intravascularly depleted.  Albumin as above  Hypotension: Relative.  Intravascular repletion as above.  Consider pressors or midodrine to maintain MAP greater than 65.  Liver abscess: Possibly from gallbladder ulceration.  Currently on Zosyn monotherapy.  Significant fluctuating leukocytosis.  Infectious disease and general surgery managing.  Anemia: Hemoglobin 8.9 decreased from 12.1.  Continue to monitor.  Kidney disease likely contributing minimally.  Hyperglycemia: Noted intermittently.  Hemoglobin A1c added on  Hypoalbuminemia: 1.8 today.  Likely negative acute phase reactant.  Providing repletion as above.  Hyponatremia: Sodium 133 today.  Likely related to free water retention in the setting of AKI.  Continue to  monitor  NAGMA: Bicarb 17 today.  Start sodium bicarb 1300 mg twice daily.   Recommendations conveyed to primary service.    Darnell Level [facility] 12/25/2019 10:26 AM   _____________________________________________________________________________________ CC: AKI  History of Present Illness: [facility] is a/an 49 y.o. male with no known past medical history who presents with fevers and chills..  Patient initially presented to the hospital on 9/15 with fevers and chills.  He had been noticing these fevers and chills for a couple weeks which is limited his ability to perform daily tasks.  He was also having intermittent right-sided back pain.  He also had noticed intermittent sweating as well as being thirsty.  He also has had significant weight loss due to poor p.o. intake and intermittent diarrhea.  He has had some emesis and nausea which improved since hospitalization.  He denies any history with his kidneys in the past adamantly stating "there is nothing wrong with my kidneys."  He has not had dysuria and denies any history of hematuria.  He does use NSAIDs intermittently.  On admission he was found to have a large posterior liver abscess and it was thought this might be coming from his gallbladder due to ulceration.  The patient underwent drain placement on 9/16.  Infectious disease was involved and helped with antibiotic management.  The patient's creatinine was 1.7 on arrival and again 1.7 on 9/16.  The patient then had a repeat creatinine of 5.8 on 9/18.  The patient has some ongoing hypotension and also received contrast on 9/15.  He denies any difficulties urinating or changes to his urinary habits.  He denies confusion.  He continues to have intermittent belly pain.   Medications:  Current Facility-Administered Medications  Medication Dose Route  Frequency Provider Last Rate Last Admin  . acetaminophen (TYLENOL) tablet 1,000 mg  1,000 mg Oral Q6H PRN  Romie Levee, MD      . Chlorhexidine Gluconate Cloth 2 % PADS 6 each  6 each Topical Daily Drema Dallas, MD   6 each at 12/24/19 1249  . diphenhydrAMINE (BENADRYL) capsule 25 mg  25 mg Oral Q6H PRN Maczis, Elmer Sow, PA-C       Or  . diphenhydrAMINE (BENADRYL) injection 25 mg  25 mg Intravenous Q6H PRN Maczis, Elmer Sow, PA-C      . docusate sodium (COLACE) capsule 100 mg  100 mg Oral BID Romie Levee, MD   100 mg at 12/25/19 1006  . HYDROcodone-acetaminophen (NORCO/VICODIN) 5-325 MG per tablet 1-2 tablet  1-2 tablet Oral Q6H PRN Romie Levee, MD      . melatonin tablet 3 mg  3 mg Oral QHS Jacinto Halim, PA-C   3 mg at 12/24/19 2103  . morphine 2 MG/ML injection 2 mg  2 mg Intravenous Q2H PRN Romie Levee, MD      . ondansetron (ZOFRAN-ODT) disintegrating tablet 4 mg  4 mg Oral Q6H PRN Romie Levee, MD       Or  . ondansetron [facility]) injection 4 mg  4 mg Intravenous Q6H PRN Romie Levee, MD      . piperacillin-tazobactam (ZOSYN) IVPB 2.25 g  2.25 g Intravenous Q6H Phylliss Blakes, RPH 100 mL/hr at 12/25/19 0614 2.25 g at 12/25/19 0614  . protein supplement (ENSURE MAX) liquid  11 oz Oral Daily Andria Meuse, MD   11 oz at 12/23/19 1403  . simethicone (MYLICON) chewable tablet 40 mg  40 mg Oral Q6H PRN Romie Levee, MD      . sodium bicarbonate tablet 1,300 mg  1,300 mg Oral TID Darnell Level, MD   1,300 mg at 12/25/19 1007  . sodium chloride flush (NS) 0.9 % injection 10-40 mL  10-40 mL Intracatheter PRN Drema Dallas, MD      . sodium chloride flush (NS) 0.9 % injection 5 mL  5 mL Intracatheter Q8H Suttle, Thressa Sheller, MD   5 mL at 12/25/19 3143     ALLERGIES Patient has no known allergies.  MEDICAL HISTORY History reviewed. No pertinent past medical history.   SOCIAL HISTORY Social History   Socioeconomic History  . Marital status: Divorced    Spouse name: Not on file  . Number of children: Not on file  . Years of education: Not on file  .  Highest education level: Not on file  Occupational History  . Not on file  Tobacco Use  . Smoking status: Light Tobacco Smoker    Types: Cigars  . Smokeless tobacco: Never Used  . Tobacco comment: black and milds when drinking  Vaping Use  . Vaping Use: Never used  Substance and Sexual Activity  . Alcohol use: Not Currently  . Drug use: No  . Sexual activity: Not on file  Other Topics Concern  . Not on file  Social History Narrative  . Not on file   Social Determinants of Health   Financial Resource Strain:   . Difficulty of Paying Living Expenses: Not on file  Food Insecurity:   . Worried About Programme researcher, broadcasting/film/video in the Last Year: Not on file  . Ran Out of Food in the Last Year: Not on file  Transportation Needs:   . Lack of Transportation (Medical): Not on file  .  Lack of Transportation (Non-Medical): Not on file  Physical Activity:   . Days of Exercise per Week: Not on file  . Minutes of Exercise per Session: Not on file  Stress:   . Feeling of Stress : Not on file  Social Connections:   . Frequency of Communication with Friends and Family: Not on file  . Frequency of Social Gatherings with Friends and Family: Not on file  . Attends Religious Services: Not on file  . Active Member of Clubs or Organizations: Not on file  . Attends Banker Meetings: Not on file  . Marital Status: Not on file  Intimate Partner Violence:   . Fear of Current or Ex-Partner: Not on file  . Emotionally Abused: Not on file  . Physically Abused: Not on file  . Sexually Abused: Not on file     FAMILY HISTORY Family History  Problem Relation Age of Onset  . Obesity Mother   . Heart failure Mother   . Hypertension Mother   . Cancer Father       Review of Systems: 12 systems reviewed Otherwise as per HPI, all other systems reviewed and negative  Physical Exam: Vitals:   12/24/19 2032 12/25/19 0638  BP: 102/70 99/78  Pulse: 70 63  Resp: 16 18  Temp: 98 F (36.7  C) 97.8 F (36.6 C)  SpO2: 100% 100%   No intake/output data recorded.  Intake/Output Summary (Last 24 hours) at 12/25/2019 1026 Last data filed at 12/25/2019 5176 Gross per 24 hour  Intake 4406.09 ml  Output 100 ml  Net 4306.09 ml   General: well-appearing, no acute distress HEENT: No nasal discharge, moist mucous membranes CV: Normal rate, no murmurs, no edema Lungs: clear to auscultation bilaterally, normal work of breathing Abd: soft, mild tenderness to palpation on the right side, non-distended Skin: no visible lesions or rashes Psych: alert, engaged, appropriate mood and affect Musculoskeletal: no obvious deformities Neuro: normal speech, no gross focal deficits   Test Results Reviewed Lab Results  Component Value Date   NA 132 (L) 12/25/2019   K 4.1 12/25/2019   CL 104 12/25/2019   CO2 17 (L) 12/25/2019   BUN 68 (H) 12/25/2019   CREATININE 6.25 (H) 12/25/2019   CALCIUM 7.9 (L) 12/25/2019   ALBUMIN 2.1 (L) 12/25/2019     I have reviewed all relevant outside healthcare records related to the patient's current hospitalization

## 2019-12-25 NOTE — Progress Notes (Signed)
Nephrology Follow-Up Consult note   Assessment/Recommendations: Dillon Wolfe is a/an 49 y.o. male with no past medical history admitted for liver abscess complicated by AKI  Non-Oliguric AKI: Likely secondary to ATN from hypotension as well as possible intermittent dehydration and contrast-induced injury.    AIN or other glomerulonephritis possible but less likely.  Also likely he has some CKD but no outpatient records available as he does not follow-up with a doctor regularly; now found to have diabetes.  Creatinine seems to be stabilizing.  Appears euvolemic.  We will stop maintenance fluids and provide further albumin -Albumin 50 g of 5%,, redose today -Stop MIV fluids -Renal ultrasound without concern and urinalysis plan -Follow-up PTH to possibly guide whether CKD is present -Infection management per primary team -Continue to monitor daily Cr, Dose meds for GFR -Monitor Daily I/Os, Daily weight  -Maintain MAP>65 for optimal renal perfusion.  -Avoid nephrotoxic medications including NSAIDs and Vanc/Zosyn combo -Currently no indication for HD but continue to monitor closely  Volume Status: Appears euvolemic on my exam but likely intravascularly depleted.  Albumin as above and encourage p.o. hydration  Hypotension: Relative.  Intravascular repletion as above.  Consider pressors or midodrine to maintain MAP greater than 65 if needed  Liver abscess with Bacteroides: Possibly from gallbladder ulceration.  Currently on Zosyn monotherapy.    Leukocytosis improving.  Infectious disease and general surgery managing.  Anemia: Hemoglobin steady at 8.7 today.  Continue to monitor.  Kidney disease likely contributing minimally.  Hyperglycemia/Diabetes: Likely type 2 diabetes with hemoglobin A1c of 7.6.  Recommend management per primary and should follow with a doctor regularly after this hospitalization  Hypoalbuminemia: Improved to 2.1 today with supplementation.  Likely negative acute  phase reactant.  Continue to monitor and repletion as above  Hyponatremia: Sodium 132 today.  Likely related to free water retention in the setting of AKI.  Continue to monitor  NAGMA: Bicarb 17 today.    Increase sodium bicarb 1300 mg 3 times daily.   Recommendations conveyed to primary service.    Darnell Level [facility] 12/25/2019 10:26 AM  ___________________________________________________________  CC: AKI  Interval History/Subjective: Patient states he feels well at this time.  He has been urinating without any issues.  Denies any nausea or vomiting.   Medications:  Current Facility-Administered Medications  Medication Dose Route Frequency Provider Last Rate Last Admin  . acetaminophen (TYLENOL) tablet 1,000 mg  1,000 mg Oral Q6H PRN Romie Levee, MD      . Chlorhexidine Gluconate Cloth 2 % PADS 6 each  6 each Topical Daily Drema Dallas, MD   6 each at 12/24/19 1249  . diphenhydrAMINE (BENADRYL) capsule 25 mg  25 mg Oral Q6H PRN Maczis, Elmer Sow, PA-C       Or  . diphenhydrAMINE (BENADRYL) injection 25 mg  25 mg Intravenous Q6H PRN Maczis, Elmer Sow, PA-C      . docusate sodium (COLACE) capsule 100 mg  100 mg Oral BID Romie Levee, MD   100 mg at 12/25/19 1006  . HYDROcodone-acetaminophen (NORCO/VICODIN) 5-325 MG per tablet 1-2 tablet  1-2 tablet Oral Q6H PRN Romie Levee, MD      . melatonin tablet 3 mg  3 mg Oral QHS Jacinto Halim, PA-C   3 mg at 12/24/19 2103  . morphine 2 MG/ML injection 2 mg  2 mg Intravenous Q2H PRN Romie Levee, MD      . ondansetron (ZOFRAN-ODT) disintegrating tablet 4 mg  4 mg Oral Q6H  PRN Romie Levee, MD       Or  . ondansetron [facility]) injection 4 mg  4 mg Intravenous Q6H PRN Romie Levee, MD      . piperacillin-tazobactam (ZOSYN) IVPB 2.25 g  2.25 g Intravenous Q6H Phylliss Blakes, RPH 100 mL/hr at 12/25/19 0614 2.25 g at 12/25/19 0614  . protein supplement (ENSURE MAX) liquid  11 oz Oral Daily  Andria Meuse, MD   11 oz at 12/23/19 1403  . simethicone (MYLICON) chewable tablet 40 mg  40 mg Oral Q6H PRN Romie Levee, MD      . sodium bicarbonate tablet 1,300 mg  1,300 mg Oral TID Darnell Level, MD   1,300 mg at 12/25/19 1007  . sodium chloride flush (NS) 0.9 % injection 10-40 mL  10-40 mL Intracatheter PRN Drema Dallas, MD      . sodium chloride flush (NS) 0.9 % injection 5 mL  5 mL Intracatheter Q8H Suttle, Thressa Sheller, MD   5 mL at 12/25/19 9528      Review of Systems: 10 systems reviewed and negative except per interval history/subjective  Physical Exam: Vitals:   12/24/19 2032 12/25/19 0638  BP: 102/70 99/78  Pulse: 70 63  Resp: 16 18  Temp: 98 F (36.7 C) 97.8 F (36.6 C)  SpO2: 100% 100%   No intake/output data recorded.  Intake/Output Summary (Last 24 hours) at 12/25/2019 1026 Last data filed at 12/25/2019 4132 Gross per 24 hour  Intake 4406.09 ml  Output 100 ml  Net 4306.09 ml   Constitutional: well-appearing, no acute distress ENMT: ears and nose without scars or lesions, MMM CV: normal rate, no edema Respiratory: Bilateral chest rise, normal work of breathing Gastrointestinal: soft, bowel sounds present Skin: no visible lesions or rashes Psych: alert, judgement/insight appropriate, appropriate mood and affect   Test Results I personally reviewed new and old clinical labs and radiology tests Lab Results  Component Value Date   NA 132 (L) 12/25/2019   K 4.1 12/25/2019   CL 104 12/25/2019   CO2 17 (L) 12/25/2019   BUN 68 (H) 12/25/2019   CREATININE 6.25 (H) 12/25/2019   CALCIUM 7.9 (L) 12/25/2019   ALBUMIN 2.1 (L) 12/25/2019

## 2019-12-25 NOTE — Progress Notes (Signed)
Subjective/Chief Complaint: DOING WELL  Eager to go home    Objective: Vital signs in last 24 hours: Temp:  [97.8 F (36.6 C)-98 F (36.7 C)] 97.8 F (36.6 C) (09/19 4742) Pulse Rate:  [63-73] 63 (09/19 0638) Resp:  [16-18] 18 (09/19 5956) BP: (99-102)/(70-79) 99/78 (09/19 3875) SpO2:  [99 %-100 %] 100 % (09/19 6433) Weight:  [102.4 kg] 102.4 kg (09/19 0500) Last BM Date: 12/24/19  Intake/Output from previous day: 09/18 0701 - 09/19 0700 In: 4646.1 [P.O.:1240; I.V.:3042.7; IV Piggyback:358.4] Out: 100 [Drains:100] Intake/Output this shift: No intake/output data recorded.   General appearance: alert and cooperative Resp: clear to auscultation bilaterally Cardio: regular rate and rhythm, S1, S2 normal, no murmur, click, rub or gallop GI: Perc drian RUQ noted  brown drainage  Neurologic: Grossly normal  Lab Results:  Recent Labs    12/24/19 0329 12/25/19 0306  WBC 22.7* 15.5*  HGB 8.9* 8.7*  HCT 26.8* 25.6*  PLT 164 185   BMET Recent Labs    12/24/19 0329 12/25/19 0306  NA 133* 132*  K 4.2 4.1  CL 104 104  CO2 17* 17*  GLUCOSE 159* 119*  BUN 67* 68*  CREATININE 5.78* 6.25*  CALCIUM 8.2* 7.9*   PT/INR No results for input(s): LABPROT, INR in the last 72 hours. ABG No results for input(s): PHART, HCO3 in the last 72 hours.  Invalid input(s): PCO2, PO2  Studies/Results: US RENAL  Result Date: 12/24/2019 CLINICAL DATA:  Acute renal insufficiency. EXAM: RENAL / URINARY TRACT ULTRASOUND COMPLETE COMPARISON:  CT 12/21/2019 FINDINGS: Right Kidney: Renal measurements: 11.7 x 6.2 x 6.1 cm = volume: 230 mL. Echogenicity within normal limits. No mass or hydronephrosis visualized. Left Kidney: Renal measurements: 12.5 x 6.3 x 6.0 cm = volume: 249 mL. Echogenicity within normal limits. No mass or hydronephrosis visualized. Bladder: Appears normal for degree of bladder distention. Bilateral ureteral jets visualized. Other: None. IMPRESSION: Normal size kidneys  without hydronephrosis. Electronically Signed   By: Elberta Fortis M.D.   On: 12/24/2019 12:31   CT IMAGE GUIDED DRAINAGE BY PERCUTANEOUS CATHETER  Result Date: 12/23/2019 INDICATION: 49 year old male with intra-abdominal/intrahepatic fluid collection concerning for abscess. EXAM: CT-guided drain placement MEDICATIONS: The patient is currently admitted to the hospital and receiving intravenous antibiotics. The antibiotics were administered within an appropriate time frame prior to the initiation of the procedure. ANESTHESIA/SEDATION: Fentanyl 50 mcg IV; Versed 2 mg IV Moderate Sedation Time:  22 The patient was continuously monitored during the procedure by the interventional radiology nurse under my direct supervision. COMPLICATIONS: None immediate. PROCEDURE: Informed written consent was obtained from the patient after a thorough discussion of the procedural risks, benefits and alternatives. All questions were addressed. Maximal Sterile Barrier Technique was utilized including caps, mask, sterile gowns, sterile gloves, sterile drape, hand hygiene and skin antiseptic. A timeout was performed prior to the initiation of the procedure. The patient's right upper quadrant was prepped and draped in standard fashion. Planning CT demonstrated similar appearance of previously visualized intrahepatic multi loculated fluid collection containing an approximately 1.3 cm radiopaque calculus in the periphery. There are scattered air-fluid levels. The procedure was planned. A small amount of local anesthesia was provided at the skin entry site with 1% lidocaine with epinephrine. Deeper local anesthesia was provided along the planned drain entry path. An 18 gauge trocar needle was advanced under intermittent CT fluoroscopy into the fluid collection. Immediately, there was peroneal aunt, foul-smelling aspirate from the initial needle placement. An Amplatz wire was coiled within  the fluid collection. Serial dilation was performed  with 8 and 12 [place] dilators. A 12 French pigtail drainage catheter was advanced into the fluid collection in the pigtail portion with locked. Approximately 200 mL of purulence aspirate were removed and the drain was then placed to bag drainage. The samples were sent to the lab for culture. The patient tolerated the procedure well without immediate complication. IMPRESSION: Successful CT-guided abdominal/intrahepatic drain placement. Preprocedure CT demonstrates fluid collection originating at the tip of the appendix. Calculus within the fluid collection likely represents appendicoliths. The gallbladder appears minimally inflamed, likely reactive, but intact and separate from the fluid collection, suggesting etiology of fluid collection is secondary to perforated appendicitis. Electronically Signed   By: Marliss Coots MD   On: 12/23/2019 13:02    Anti-infectives: Anti-infectives (From admission, onward)   Start     Dose/Rate Route Frequency Ordered Stop   12/24/19 0600  piperacillin-tazobactam (ZOSYN) IVPB 2.25 g  Status:  Discontinued        2.25 g 100 mL/hr over 30 Minutes Intravenous Every 8 hours 12/24/19 0522 12/24/19 0524   12/24/19 0600  piperacillin-tazobactam (ZOSYN) IVPB 2.25 g        2.25 g 100 mL/hr over 30 Minutes Intravenous Every 6 hours 12/24/19 0524     12/21/19 2200  piperacillin-tazobactam (ZOSYN) IVPB 3.375 g  Status:  Discontinued        3.375 g 12.5 mL/hr over 240 Minutes Intravenous Every 8 hours 12/21/19 2110 12/24/19 0522   12/21/19 1430  piperacillin-tazobactam (ZOSYN) IVPB 3.375 g        3.375 g 100 mL/hr over 30 Minutes Intravenous  Once 12/21/19 1418 12/21/19 1527      Assessment/Plan:   Liver abscess 12/21/2019   Possibly perforated gallbladder based on what appears to be intraperitoneal gallstone; total bilirubin elevated  -IR  drainage -Repeat CMP, monitor drain outputs -Ok for diet following - regular -Continue broad spec IV abx -SCDs; RESTART  LOVANOX  ARF- Cr over 6- nephrology following     IVF for now and monitor UOP and labs     LOS: 4 days    Dortha Schwalbe MD  12/25/2019

## 2019-12-26 LAB — COMPREHENSIVE METABOLIC PANEL
ALT: 81 U/L — ABNORMAL HIGH (ref 0–44)
AST: 61 U/L — ABNORMAL HIGH (ref 15–41)
Albumin: 2.7 g/dL — ABNORMAL LOW (ref 3.5–5.0)
Alkaline Phosphatase: 71 U/L (ref 38–126)
Anion gap: 12 (ref 5–15)
BUN: 71 mg/dL — ABNORMAL HIGH (ref 6–20)
CO2: 18 mmol/L — ABNORMAL LOW (ref 22–32)
Calcium: 8.5 mg/dL — ABNORMAL LOW (ref 8.9–10.3)
Chloride: 103 mmol/L (ref 98–111)
Creatinine, Ser: 6.48 mg/dL — ABNORMAL HIGH (ref 0.61–1.24)
GFR calc Af Amer: 11 mL/min — ABNORMAL LOW (ref >60)
GFR calc non Af Amer: 9 mL/min — ABNORMAL LOW (ref >60)
Glucose, Bld: 117 mg/dL — ABNORMAL HIGH (ref 70–99)
Potassium: 4.3 mmol/L (ref 3.5–5.1)
Sodium: 133 mmol/L — ABNORMAL LOW (ref 135–145)
Total Bilirubin: 1.5 mg/dL — ABNORMAL HIGH (ref 0.3–1.2)
Total Protein: 7.2 g/dL (ref 6.5–8.1)

## 2019-12-26 LAB — CULTURE, BLOOD (ROUTINE X 2)
Culture: NO GROWTH
Special Requests: ADEQUATE

## 2019-12-26 LAB — AEROBIC/ANAEROBIC CULTURE W GRAM STAIN (SURGICAL/DEEP WOUND): Special Requests: NORMAL

## 2019-12-26 MED ORDER — METRONIDAZOLE 500 MG PO TABS
500.0000 mg | ORAL_TABLET | Freq: Three times a day (TID) | ORAL | Status: DC
  Administered 2019-12-26 – 2019-12-30 (×12): 500 mg via ORAL
  Filled 2019-12-26 (×12): qty 1

## 2019-12-26 MED ORDER — SODIUM CHLORIDE 0.9 % IV SOLN
2.0000 g | INTRAVENOUS | Status: DC
  Administered 2019-12-26 – 2019-12-30 (×5): 2 g via INTRAVENOUS
  Filled 2019-12-26 (×2): qty 2
  Filled 2019-12-26: qty 20
  Filled 2019-12-26 (×3): qty 2

## 2019-12-26 MED ORDER — LIVING WELL WITH DIABETES BOOK
Freq: Once | Status: AC
  Filled 2019-12-26: qty 1

## 2019-12-26 NOTE — Progress Notes (Signed)
PHARMACY CONSULT NOTE FOR:  OUTPATIENT  PARENTERAL ANTIBIOTIC THERAPY (OPAT)  Indication: Perforated appendix with liver abscess/ B fragilis bacteremia Regimen: Ceftriaxone 2 gm IV Q 24 hours + Metronidazole 500 mg PO Q 8 hours  End date: 01/11/20  IV antibiotic discharge orders are pended. To discharging provider:  please sign these orders via discharge navigator,  Select New Orders & click on the button choice - Manage This Unsigned Work.     Thank you for allowing pharmacy to be a part of this patient's care.  Sharin Mons, PharmD, BCPS, BCIDP Infectious Diseases Clinical Pharmacist Phone: [phone] 12/26/2019, 3:40 PM

## 2019-12-26 NOTE — Progress Notes (Signed)
Referring Physician(s): White,C  Supervising Physician: Malachy Moan  Patient Status:  [facility] - In-pt  Chief Complaint:  Intra- abdominal/intrahepatic abscess  Subjective: Pt lying in bed on phone; denies worsening abd pain,N/V   Allergies: Patient has no known allergies.  Medications: Prior to Admission medications   Medication Sig Start Date End Date Taking? Authorizing Provider  ibuprofen (ADVIL,MOTRIN) 800 MG tablet Take 1 tablet (800 mg total) by mouth 3 (three) times daily. Patient taking differently: Take 800 mg by mouth in the morning.  09/24/17  Yes Melene Plan, DO  HYDROcodone-acetaminophen (NORCO/VICODIN) 5-325 MG per tablet 1-2 tablets po q 6 hours prn moderate to severe pain Patient not taking: Reported on 12/21/2019 03/01/12   Quita Skye, MD     Vital Signs: BP 110/75 (BP Location: Right Arm)   Pulse 66   Temp 97.9 F (36.6 C)   Resp 16   Ht 5\' 8"  (1.727 m)   Wt 225 lb 15.5 oz (102.5 kg)   SpO2 100%   BMI 34.36 kg/m   Physical Exam awake/alert; RUQ drain intact, insertion site ok, NT, OP 105  cc turbid yellow fluid, drain flushed without difficulty  Imaging: RENAL  Result Date: 12/24/2019 CLINICAL DATA:  Acute renal insufficiency. EXAM: RENAL / URINARY TRACT ULTRASOUND COMPLETE COMPARISON:  CT 12/21/2019 FINDINGS: Right Kidney: Renal measurements: 11.7 x 6.2 x 6.1 cm = volume: 230 mL. Echogenicity within normal limits. No mass or hydronephrosis visualized. Left Kidney: Renal measurements: 12.5 x 6.3 x 6.0 cm = volume: 249 mL. Echogenicity within normal limits. No mass or hydronephrosis visualized. Bladder: Appears normal for degree of bladder distention. Bilateral ureteral jets visualized. Other: None. IMPRESSION: Normal size kidneys without hydronephrosis. Electronically Signed   By: 12/23/2019 M.D.   On: 12/24/2019 12:31   CT IMAGE GUIDED DRAINAGE BY PERCUTANEOUS CATHETER  Result Date: 12/23/2019 INDICATION: 49 year old male with  intra-abdominal/intrahepatic fluid collection concerning for abscess. EXAM: CT-guided drain placement MEDICATIONS: The patient is currently admitted to the hospital and receiving intravenous antibiotics. The antibiotics were administered within an appropriate time frame prior to the initiation of the procedure. ANESTHESIA/SEDATION: Fentanyl 50 mcg IV; Versed 2 mg IV Moderate Sedation Time:  22 The patient was continuously monitored during the procedure by the interventional radiology nurse under my direct supervision. COMPLICATIONS: None immediate. PROCEDURE: Informed written consent was obtained from the patient after a thorough discussion of the procedural risks, benefits and alternatives. All questions were addressed. Maximal Sterile Barrier Technique was utilized including caps, mask, sterile gowns, sterile gloves, sterile drape, hand hygiene and skin antiseptic. A timeout was performed prior to the initiation of the procedure. The patient's right upper quadrant was prepped and draped in standard fashion. Planning CT demonstrated similar appearance of previously visualized intrahepatic multi loculated fluid collection containing an approximately 1.3 cm radiopaque calculus in the periphery. There are scattered air-fluid levels. The procedure was planned. A small amount of local anesthesia was provided at the skin entry site with 1% lidocaine with epinephrine. Deeper local anesthesia was provided along the planned drain entry path. An 18 gauge trocar needle was advanced under intermittent CT fluoroscopy into the fluid collection. Immediately, there was peroneal aunt, foul-smelling aspirate from the initial needle placement. An Amplatz wire was coiled within the fluid collection. Serial dilation was performed with 8 and 12 44 dilators. A 12 French pigtail drainage catheter was advanced into the fluid collection in the pigtail portion with locked. Approximately 200 mL of purulence aspirate were removed and  the  drain was then placed to bag drainage. The samples were sent to the lab for culture. The patient tolerated the procedure well without immediate complication. IMPRESSION: Successful CT-guided abdominal/intrahepatic drain placement. Preprocedure CT demonstrates fluid collection originating at the tip of the appendix. Calculus within the fluid collection likely represents appendicoliths. The gallbladder appears minimally inflamed, likely reactive, but intact and separate from the fluid collection, suggesting etiology of fluid collection is secondary to perforated appendicitis. Electronically Signed   By: Marliss Coots MD   On: 12/23/2019 13:02    Labs:  CBC: Recent Labs    12/21/19 1300 12/22/19 0310 12/24/19 0329 12/25/19 0306  WBC 22.2* 7.9 22.7* 15.5*  HGB 11.1* 12.1* 8.9* 8.7*  HCT 32.7* 39.0 26.8* 25.6*  PLT 289 237 164 185    COAGS: Recent Labs    12/21/19 1420  INR 1.3*  APTT 45*    BMP: Recent Labs    12/22/19 0310 12/24/19 0329 12/25/19 0306 12/26/19 0332  NA 135 133* 132* 133*  K 3.9 4.2 4.1 4.3  CL 100 104 104 103  CO2 14* 17* 17* 18*  GLUCOSE 128* 159* 119* 117*  BUN 25* 67* 68* 71*  CALCIUM 8.6* 8.2* 7.9* 8.5*  CREATININE 1.67* 5.78* 6.25* 6.48*  GFRNONAA 47* 10* 10* 9*  GFRAA 54* 12* 11* 11*    LIVER FUNCTION TESTS: Recent Labs    12/21/19 1300 12/24/19 0329 12/25/19 0306 12/26/19 0332  BILITOT 2.2* 1.6* 1.1 1.5*  AST 79* 105* 62* 61*  ALT 75* 128* 89* 81*  ALKPHOS 111 80 64 71  PROT 8.6* 6.8 6.3* 7.2  ALBUMIN 2.5* 1.8* 2.1* 2.7*    Assessment and Plan: S/p intraabdominal/intrahepatic abscess drain placement 9/17; afebrile; WBC pend today, 15.5 yesterday, down from 22.7;  creat 6.48- nephrology following; t bili 1.5(1.1); drain fl cx- e coli, strept constellatus; blood cx - bacteroides; cont current tx/drain irrigation; check f/u CT once OP minimal or if WBC increases  Electronically Signed: D. Jeananne Rama, PA-C 12/26/2019, 2:12 PM   I  spent a total of 15 minutes at the the patient's bedside AND on the patient's hospital floor or unit, greater than 50% of which was counseling/coordinating care for intraabdominal/intrahepatic abscess drain    Patient ID: Dillon Wolfe, male   DOB: [birth year], 49 y.o.   MRN: [id number]

## 2019-12-26 NOTE — Progress Notes (Addendum)
[facility] Progress Note     Subjective: CC:  No new complaints. Denies abdominal pain, fever, chills. No reported CP, SOB, urinary sxs. Attributes his kidney injury to the CT scan/contrast. Denies a history of DM. Stats he doesn't have a PCP. 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  Objective: Vital signs in last 24 hours: Temp:  [97.5 F (36.4 C)-98.1 F (36.7 C)] 98 F (36.7 C) (09/20 0616) Pulse Rate:  [68-71] 68 (09/20 0616) Resp:  [18-20] 18 (09/20 0616) BP: (90-112)/(65-79) 112/79 (09/20 0616) SpO2:  [100 %] 100 % (09/20 0616) Weight:  [102.5 kg] 102.5 kg (09/20 0500) Last BM Date: 12/25/19  Intake/Output from previous day: 09/19 0701 - 09/20 0700 In: 1232.6 [P.O.:750; I.V.:4.3; IV Piggyback:468.3] Out: 105 [Drains:105] Intake/Output this shift: No intake/output data recorded.  PE: Gen:  Alert, NAD, pleasant Card:  Regular rate and rhythm, pedal pulses 2+ BL Pulm:  Normal effort, clear to auscultation bilaterally Abd: Soft, non-tender, non-distended, bowel sounds present in all 4 quadrants, R perc drain with purulent fluid Skin: warm and dry, no rashes  Psych: A&Ox3   Lab Results:  Recent Labs    12/24/19 0329 12/25/19 0306  WBC 22.7* 15.5*  HGB 8.9* 8.7*  HCT 26.8* 25.6*  PLT 164 185   BMET Recent Labs    12/25/19 0306 12/26/19 0332  NA 132* 133*  K 4.1 4.3  CL 104 103  CO2 17* 18*  GLUCOSE 119* 117*  BUN 68* 71*  CREATININE 6.25* 6.48*  CALCIUM 7.9* 8.5*   PT/INR No results for input(s): LABPROT, INR in the last 72 hours. CMP     Component Value Date/Time   NA 133 (L) 12/26/2019 0332   K 4.3 12/26/2019 0332   CL 103 12/26/2019 0332   CO2 18 (L) 12/26/2019 0332   GLUCOSE 117 (H) 12/26/2019 0332   BUN 71 (H) 12/26/2019 0332   CREATININE 6.48 (H) 12/26/2019 0332   CALCIUM 8.5 (L) 12/26/2019 0332   PROT 7.2 12/26/2019 0332   ALBUMIN 2.7 (L) 12/26/2019 0332   AST 61 (H)  12/26/2019 0332   ALT 81 (H) 12/26/2019 0332   ALKPHOS 71 12/26/2019 0332   BILITOT 1.5 (H) 12/26/2019 0332   GFRNONAA 9 (L) 12/26/2019 0332   GFRAA 11 (L) 12/26/2019 0332   Lipase  No results found for: LIPASE     Studies/Results: US RENAL  Result Date: 12/24/2019 CLINICAL DATA:  Acute renal insufficiency. EXAM: RENAL / URINARY TRACT ULTRASOUND COMPLETE COMPARISON:  CT 12/21/2019 FINDINGS: Right Kidney: Renal measurements: 11.7 x 6.2 x 6.1 cm = volume: 230 mL. Echogenicity within normal limits. No mass or hydronephrosis visualized. Left Kidney: Renal measurements: 12.5 x 6.3 x 6.0 cm = volume: 249 mL. Echogenicity within normal limits. No mass or hydronephrosis visualized. Bladder: Appears normal for degree of bladder distention. Bilateral ureteral jets visualized. Other: None. IMPRESSION: Normal size kidneys without hydronephrosis. Electronically Signed   By: Elberta Fortis M.D.   On: 12/24/2019 12:31    Anti-infectives: Anti-infectives (From admission, onward)   Start     Dose/Rate Route Frequency Ordered Stop   12/24/19 0600  piperacillin-tazobactam (ZOSYN) IVPB 2.25 g  Status:  Discontinued        2.25 g 100 mL/hr over 30 Minutes Intravenous Every 8 hours 12/24/19 0522 12/24/19 0524   12/24/19 0600  piperacillin-tazobactam (ZOSYN) IVPB 2.25 g        2.25 g 100 mL/hr over 30 Minutes Intravenous  Every 6 hours 12/24/19 0524     12/21/19 2200  piperacillin-tazobactam (ZOSYN) IVPB 3.375 g  Status:  Discontinued        3.375 g 12.5 mL/hr over 240 Minutes Intravenous Every 8 hours 12/21/19 2110 12/24/19 0522   12/21/19 1430  piperacillin-tazobactam (ZOSYN) IVPB 3.375 g        3.375 g 100 mL/hr over 30 Minutes Intravenous  Once 12/21/19 1418 12/21/19 1527       Assessment/Plan AKI - appreciate nephrology following, BUN 71, Cr 6.48 (from 6.25). IVF was stopped 9/19. Receiving albmin and bicarb; OP follow up for workup of CKD.  Likely type 2 DM - A1c 7.6, will need follow up with a  PCP for this   Bacteroides fragilis bacteremia RUQ abscess  - afebrile, VSS, WBC 15 yesterday from 9  - DDx: perforated gallbladder w/ intraperitoneal gallstone vs perforated appendicitis  - S/P IR perc drain 12/23/19, continue drain; cultures w/ E.coli, strep constellatus resistant to ampicillin  - appreciate ID consult, continue IV Zosyn, potentially home on Invanz? Plan for 3 weeks of IV abx then repeat imaging.  FEN: Reg diet  ID: Zosyn 9/15 >>  VTE: Lovenox  Foley: None Dispo: med-surg, nephrology and ID following; CBC and CMP in AM.  Diabetes coordinator consulted, Discussed with Dr. Dairl Ponder, Medicine consult for diabetes; with renal failure and low glucose he does not need anything treatment for now.  He will only need follow up with PCP for diabetes as OP, unless his glucose starts going up.  If that occurs we will need Medicine to see and assist with diabetes at that point.    LOS: 5 days    Hosie Spangle, [facility] Surgery Please see Amion for pager number during day hours 7:00am-4:30pm  Agree with above.  Ovidio Kin, MD, [facility] Surgery Office phone:  [phone]

## 2019-12-26 NOTE — Progress Notes (Signed)
Inpatient Diabetes Program Recommendations  AACE/ADA: New Consensus Statement on Inpatient Glycemic Control (2015)  Target Ranges:  Prepandial:   less than 140 mg/dL      Peak postprandial:   less than 180 mg/dL (1-2 hours)      Critically ill patients:  140 - 180 mg/dL   Lab Results  Component Value Date   GLUCAP 195 (H) 12/21/2019   HGBA1C 7.6 (H) 12/24/2019    Review of Glycemic Control  Diabetes history: None Outpatient Diabetes medications: None Current orders for Inpatient glycemic control: None  New diagnosis with HgbA1C of 7.6%. Ordered Living Well with Diabetes book  Inpatient Diabetes Program Recommendations:     Add Novolog 0-9 units tidwc Change diet to CHO mod med  Spoke with pt this afternoon regarding new diagnosis of DM and HgbA1C of 7.6%. Pt states he has family hx IDDM. York Spaniel he has lost about 30 pounds in the past 3-4 weeks. [place] Spaniel he is very active, walks a lot at work and also walks on trails near [place]. States he does drink regular sodas and Gatorade. We discussed leaving off simple CHOs. Pt very motivated to make lifestyle changes to control blood sugars.  Continue to follow. Will need glucose meter prescription at discharge.   Thank you. Ailene Ards, RD, LDN, CDE Inpatient Diabetes Coordinator [phone]

## 2019-12-26 NOTE — Progress Notes (Addendum)
INFECTIOUS DISEASE PROGRESS NOTE  ID: Dillon Wolfe is a 49 y.o. male with  Active Problems:   Liver abscess  Subjective: Questions about d/c, about his new dx of diabetes.   Abtx:  Anti-infectives (From admission, onward)   Start     Dose/Rate Route Frequency Ordered Stop   12/24/19 0600  piperacillin-tazobactam (ZOSYN) IVPB 2.25 g  Status:  Discontinued        2.25 g 100 mL/hr over 30 Minutes Intravenous Every 8 hours 12/24/19 0522 12/24/19 0524   12/24/19 0600  piperacillin-tazobactam (ZOSYN) IVPB 2.25 g        2.25 g 100 mL/hr over 30 Minutes Intravenous Every 6 hours 12/24/19 0524     12/21/19 2200  piperacillin-tazobactam (ZOSYN) IVPB 3.375 g  Status:  Discontinued        3.375 g 12.5 mL/hr over 240 Minutes Intravenous Every 8 hours 12/21/19 2110 12/24/19 0522   12/21/19 1430  piperacillin-tazobactam (ZOSYN) IVPB 3.375 g        3.375 g 100 mL/hr over 30 Minutes Intravenous  Once 12/21/19 1418 12/21/19 1527      Medications:  Scheduled: . Chlorhexidine Gluconate Cloth  6 each Topical Daily  . docusate sodium  100 mg Oral BID  . living well with diabetes book   Does not apply Once  . melatonin  3 mg Oral QHS  . Ensure Max Protein  11 oz Oral Daily  . sodium bicarbonate  1,300 mg Oral TID  . sodium chloride flush  5 mL Intracatheter Q8H    Objective: Vital signs in last 24 hours: Temp:  [97.5 F (36.4 C)-98.1 F (36.7 C)] 98 F (36.7 C) (09/20 0616) Pulse Rate:  [68-71] 68 (09/20 0616) Resp:  [18-20] 18 (09/20 0616) BP: (90-112)/(65-79) 112/79 (09/20 0616) SpO2:  [100 %] 100 % (09/20 0616) Weight:  [102.5 kg] 102.5 kg (09/20 0500)   General appearance: alert, cooperative and no distress Resp: clear to auscultation bilaterally Cardio: regular rate and rhythm GI: normal findings: bowel sounds normal and soft, non-tender and RUQ drain in place Extremities: LUE PIC clean  Lab Results Recent Labs    12/24/19 0329 12/24/19 0329 12/25/19 0306  12/26/19 0332  WBC 22.7*  --  15.5*  --   HGB 8.9*  --  8.7*  --   HCT 26.8*  --  25.6*  --   NA 133*   < > 132* 133*  K 4.2   < > 4.1 4.3  CL 104   < > 104 103  CO2 17*   < > 17* 18*  BUN 67*   < > 68* 71*  CREATININE 5.78*   < > 6.25* 6.48*   < > = values in this interval not displayed.   Liver Panel Recent Labs    12/25/19 0306 12/26/19 0332  PROT 6.3* 7.2  ALBUMIN 2.1* 2.7*  AST 62* 61*  ALT 89* 81*  ALKPHOS 64 71  BILITOT 1.1 1.5*   Sedimentation Rate No results for input(s): ESRSEDRATE in the last 72 hours. C-Reactive Protein No results for input(s): CRP in the last 72 hours.  Microbiology: Recent Results (from the past 240 hour(s))  Urine culture     Status: None   Collection Time: 12/21/19  2:20 PM   Specimen: In/Out Cath Urine  Result Value Ref Range Status   Specimen Description   Final    IN/OUT CATH URINE Performed at [facility], [place] [address], [place], [place] [address]  Special Requests   Final    NONE Performed at [facility], [place] [address], [place], [place] [address]    Culture   Final    NO GROWTH Performed at [facility], [place] [address], [place], [place] [address]    Report Status 12/22/2019 FINAL  Final  Blood Culture (routine x 2)     Status: Abnormal   Collection Time: 12/21/19  2:20 PM   Specimen: BLOOD RIGHT HAND  Result Value Ref Range Status   Specimen Description   Final    BLOOD RIGHT HAND Performed at [facility] [address], [place], [place] [address]    Special Requests   Final    BOTTLES DRAWN AEROBIC AND ANAEROBIC Blood Culture adequate volume Performed at [place] [address], [place], [place] [address]    Culture  Setup Time   Final    GRAM NEGATIVE RODS ANAEROBIC BOTTLE ONLY Organism ID to follow CRITICAL RESULT CALLED TO, READ BACK BY AND VERIFIED WITH: J. LEGGE PHARMD, AT 1310 12/23/19 BY D. VANHOOK    Culture (A)   Final    BACTEROIDES FRAGILIS BETA LACTAMASE POSITIVE Performed at [facility], [place] [address] [place], [place] [address]    Report Status 12/25/2019 FINAL  Final  Blood Culture (routine x 2)     Status: None   Collection Time: 12/21/19  2:20 PM   Specimen: BLOOD  Result Value Ref Range Status   Specimen Description   Final    BLOOD LEFT ANTECUBITAL Performed at [facility], [place] [address], [place], [place] [address]    Special Requests   Final    BOTTLES DRAWN AEROBIC AND ANAEROBIC Blood Culture adequate volume Performed at [place] [address] [place], [place] [address]    Culture   Final    NO GROWTH 5 DAYS Performed at [facility], [place] [address] [place],  [address]    Report Status 12/26/2019 FINAL  Final  SARS Coronavirus 2 by RT PCR (hospital order, performed in [facility] hospital lab) Nasopharyngeal Nasopharyngeal Swab     Status: None   Collection Time: 12/21/19  2:20 PM   Specimen: Nasopharyngeal Swab  Result Value Ref Range Status   SARS Coronavirus 2 NEGATIVE NEGATIVE Final    Comment: (NOTE) SARS-CoV-2 target nucleic acids are NOT DETECTED.  The SARS-CoV-2 RNA is generally detectable in upper and lower respiratory specimens during the acute phase of infection. The lowest concentration of SARS-CoV-2 viral copies this assay can detect is 250 copies / mL. A negative result does not preclude SARS-CoV-2 infection and should not be used as the sole basis for treatment or other patient management decisions.  A negative result may occur with improper specimen collection / handling, submission of specimen other than nasopharyngeal swab, presence of viral mutation(s) within the areas targeted by this assay, and inadequate number of viral copies (<250 copies / mL). A negative result must be combined with clinical observations, patient history, and epidemiological information.  Fact Sheet for Patients:    StrictlyIdeas.no  Fact Sheet for Healthcare Providers: BankingDealers.co.za  This test is not yet approved or  cleared by the [place] FDA and has been authorized for detection and/or diagnosis of SARS-CoV-2 by FDA under an Emergency Use Authorization (EUA).  This EUA will remain in effect (meaning this test can be used) for the duration of the COVID-19 declaration under Section 564(b)(1)  of the Act, 21 U.S.C. section 360bbb-3(b)(1), unless the authorization is terminated or revoked sooner.  Performed at [facility], [place] [address] [place], [place] [address]   Blood Culture ID Panel (Reflexed)     Status: Abnormal   Collection Time: 12/21/19  2:20 PM  Result Value Ref Range Status   Enterococcus faecalis NOT DETECTED NOT DETECTED Final   Enterococcus Faecium NOT DETECTED NOT DETECTED Final   Listeria monocytogenes NOT DETECTED NOT DETECTED Final   Staphylococcus species NOT DETECTED NOT DETECTED Final   Staphylococcus aureus (BCID) NOT DETECTED NOT DETECTED Final   Staphylococcus epidermidis NOT DETECTED NOT DETECTED Final   Staphylococcus lugdunensis NOT DETECTED NOT DETECTED Final   Streptococcus species NOT DETECTED NOT DETECTED Final   Streptococcus agalactiae NOT DETECTED NOT DETECTED Final   Streptococcus pneumoniae NOT DETECTED NOT DETECTED Final   Streptococcus pyogenes NOT DETECTED NOT DETECTED Final   A.calcoaceticus-baumannii NOT DETECTED NOT DETECTED Final   Bacteroides fragilis DETECTED (A) NOT DETECTED Final    Comment: CRITICAL RESULT CALLED TO, READ BACK BY AND VERIFIED WITH: J. LEGGE PHARMD, AT 1310 12/23/19 BY D. VANHOOK    Enterobacterales NOT DETECTED NOT DETECTED Final   Enterobacter cloacae complex NOT DETECTED NOT DETECTED Final   Escherichia coli NOT DETECTED NOT DETECTED Final   Klebsiella aerogenes NOT DETECTED NOT DETECTED Final   Klebsiella oxytoca NOT DETECTED NOT DETECTED Final    Klebsiella pneumoniae NOT DETECTED NOT DETECTED Final   Proteus species NOT DETECTED NOT DETECTED Final   Salmonella species NOT DETECTED NOT DETECTED Final   Serratia marcescens NOT DETECTED NOT DETECTED Final   Haemophilus influenzae NOT DETECTED NOT DETECTED Final   Neisseria meningitidis NOT DETECTED NOT DETECTED Final   Pseudomonas aeruginosa NOT DETECTED NOT DETECTED Final   Stenotrophomonas maltophilia NOT DETECTED NOT DETECTED Final   Candida albicans NOT DETECTED NOT DETECTED Final   Candida auris NOT DETECTED NOT DETECTED Final   Candida glabrata NOT DETECTED NOT DETECTED Final   Candida krusei NOT DETECTED NOT DETECTED Final   Candida parapsilosis NOT DETECTED NOT DETECTED Final   Candida tropicalis NOT DETECTED NOT DETECTED Final   Cryptococcus neoformans/gattii NOT DETECTED NOT DETECTED Final    Comment: Performed at [facility], [address] [place], [place] [address]  Aerobic/Anaerobic Culture (surgical/deep wound)     Status: None (Preliminary result)   Collection Time: 12/23/19 11:12 AM   Specimen: Abdomen; Abscess  Result Value Ref Range Status   Specimen Description   Final    ABDOMEN Performed at [place] [address] [place], [place] [address]    Special Requests   Final    Normal Performed at [facility], [place] [address] [place], [place] [address]    Gram Stain   Final    FEW WBC PRESENT,BOTH PMN AND MONONUCLEAR ABUNDANT GRAM POSITIVE COCCI FEW GRAM VARIABLE ROD    Culture   Final    RARE ESCHERICHIA COLI MODERATE STREPTOCOCCUS CONSTELLATUS CULTURE REINCUBATED FOR BETTER GROWTH Performed at [facility], [place] [address] [place], [place] [address]    Report Status PENDING  Incomplete   Organism ID, Bacteria ESCHERICHIA COLI  Final      Susceptibility   Escherichia coli - MIC*    AMPICILLIN >=32 RESISTANT Resistant     CEFAZOLIN <=4 SENSITIVE Sensitive     CEFEPIME <=0.12 SENSITIVE  Sensitive     CEFTAZIDIME <=1 SENSITIVE Sensitive     CEFTRIAXONE <=  0.25 SENSITIVE Sensitive     CIPROFLOXACIN <=0.25 SENSITIVE Sensitive     GENTAMICIN <=1 SENSITIVE Sensitive     IMIPENEM <=0.25 SENSITIVE Sensitive     TRIMETH/SULFA <=20 SENSITIVE Sensitive     AMPICILLIN/SULBACTAM >=32 RESISTANT Resistant     PIP/TAZO <=4 SENSITIVE Sensitive     * RARE ESCHERICHIA COLI  Culture, blood (Routine X 2) w Reflex to ID Panel     Status: None (Preliminary result)   Collection Time: 12/23/19  5:06 PM   Specimen: BLOOD LEFT HAND  Result Value Ref Range Status   Specimen Description   Final    BLOOD LEFT HAND Performed at [place] [address], [place], [place] [address]    Special Requests   Final    BOTTLES DRAWN AEROBIC AND ANAEROBIC Blood Culture adequate volume Performed at [place] [address] [place], [place] [address]    Culture   Final    NO GROWTH 2 DAYS Performed at [place] [address] [place], [place] [address]    Report Status PENDING  Incomplete    Studies/Results: No results found.   Assessment/Plan: Bacteroides fragilis bacteremia Perforated appendix with liver abscess  Cx Strep constallatus, E coli (r- amp/sulb) Newly Dx DM2- A1C 7.6% AKI  Total days of antibiotics: 5 zosyn  Will change anbx to ceftriaxone and flagyl Repeat BCx 9-17 pending/ngtd Completion of anbx based on his f/u CT scan, resolution of abscess.  My great appreciation to pharmacy.  Consider IM eval for mgmt of his diabetes.  Appreciate renal eval of his Cr. Suspected due to Contrast, hypovolemia.  Available as needed.   No Known Allergies  OPAT Orders Discharge antibiotics to be given via PICC line Discharge antibiotics: ceftriaxone 2g IVPB qday, flagyl $RemoveBefo'500mg'FVmHakwafEi$  po 8h Duration: 21 days End Date: 01-11-20  [facility] Care Per Protocol: please  Home health RN for IV administration and teaching; PICC line care and labs.     Labs weekly while on IV antibiotics: _x_ CBC with differential __ BMP _x_ CMP __ CRP __ ESR __ Vancomycin trough __ CK  __ Please pull PIC at completion of IV antibiotics _x_ Please leave PIC in place until doctor has seen patient or been notified  Fax weekly labs to [phone]  Clinic Follow Up Appt: Creola Corn MD, FACP Infectious Diseases (pager) [phone] [email or web address] 12/26/2019, 11:01 AM  LOS: 5 days

## 2019-12-26 NOTE — Progress Notes (Signed)
Nephrology Follow-Up Consult note   Assessment/Recommendations: Dillon Wolfe is a/an 49 y.o. male with no past medical history admitted for liver abscess complicated by AKI  Non-Oliguric AKI: Likely secondary to ATN from hypotension + admit dehydration + contrast needed for CT scan. Nonoliguric. AIN or other glomerulonephritis possible but less likely.  Admit creat was 1.6 but was likely dehydrated. No old creatinines available.  Creat only slightly up today, seems to be stabilizing, pt appears euvolemic - cont supportive care, suspect he is starting early recovery phase - up 11 L by I/O, cont to hold IVF's, is eating and drinking - renal ultrasound without concern and urinalysis as well  Volume Status: as above, stable  Hypotension: resolving, BP's up in 110's now  Liver abscess/ bacteroides sepsis: wound cx growing several different bacteria. strep constellatus. Currently on Zosyn monotherapy.    Leukocytosis improving.  Infectious disease and general surgery managing.  Anemia: Hemoglobin steady at 8.7 today.  Continue to monitor.  Kidney disease likely contributing minimally.  Hyperglycemia/Diabetes: Likely type 2 diabetes with hemoglobin A1c of 7.6.  Recommend management per primary and should follow with a doctor regularly after this hospitalization  Hypoalbuminemia: Improved to 2.1 today with supplementation.  Likely negative acute phase reactant.  Continue to monitor and repletion as above  Hyponatremia: Sodium 132 today.  Likely related to free water retention in the setting of AKI.  Continue to monitor  NAGMA: Bicarb 17 today.    Increased sodium bicarb 1300 mg 3 times daily.     Barbette Hair [facility]hertz [facility] 12/26/2019 2:47 PM  ___________________________________________________________  CC: AKI  Interval History/Subjective: feeling okay, no c/o's.  Creat 6.2 > 6.4, no UOP recorded but pt states he is voiding well. No n/v, eating well. No SOB  or leg swelling.    Medications:  Current Facility-Administered Medications  Medication Dose Route Frequency Provider Last Rate Last Admin  . acetaminophen (TYLENOL) tablet 1,000 mg  1,000 mg Oral Q6H PRN Romie Levee, MD      . Chlorhexidine Gluconate Cloth 2 % PADS 6 each  6 each Topical Daily Drema Dallas, MD   6 each at 12/25/19 1032  . diphenhydrAMINE (BENADRYL) capsule 25 mg  25 mg Oral Q6H PRN Maczis, Elmer Sow, PA-C       Or  . diphenhydrAMINE (BENADRYL) injection 25 mg  25 mg Intravenous Q6H PRN Maczis, Elmer Sow, PA-C      . docusate sodium (COLACE) capsule 100 mg  100 mg Oral BID Romie Levee, MD   100 mg at 12/25/19 2117  . HYDROcodone-acetaminophen (NORCO/VICODIN) 5-325 MG per tablet 1-2 tablet  1-2 tablet Oral Q6H PRN Romie Levee, MD      . living well with diabetes book MISC   Does not apply Once Simaan, Elizabeth S, PA-C      . melatonin tablet 3 mg  3 mg Oral QHS Jacinto Halim, PA-C   3 mg at 12/25/19 2116  . morphine 2 MG/ML injection 2 mg  2 mg Intravenous Q2H PRN Romie Levee, MD      . ondansetron (ZOFRAN-ODT) disintegrating tablet 4 mg  4 mg Oral Q6H PRN Romie Levee, MD       Or  . ondansetron [facility]) injection 4 mg  4 mg Intravenous Q6H PRN Romie Levee, MD      . piperacillin-tazobactam (ZOSYN) IVPB 2.25 g  2.25 g Intravenous Q6H Phylliss Blakes, RPH 100 mL/hr at 12/26/19 1308 2.25 g at 12/26/19 1308  .  protein supplement (ENSURE MAX) liquid  11 oz Oral Daily Andria Meuse, MD   11 oz at 12/26/19 1002  . simethicone (MYLICON) chewable tablet 40 mg  40 mg Oral Q6H PRN Romie Levee, MD      . sodium bicarbonate tablet 1,300 mg  1,300 mg Oral TID Darnell Level, MD   1,300 mg at 12/26/19 1002  . sodium chloride flush (NS) 0.9 % injection 10-40 mL  10-40 mL Intracatheter PRN Drema Dallas, MD      . sodium chloride flush (NS) 0.9 % injection 5 mL  5 mL Intracatheter Q8H Suttle, Thressa Sheller, MD   5 mL at 12/26/19 0516      Review of  Systems: 10 systems reviewed and negative except per interval history/subjective  Physical Exam: Vitals:   12/26/19 0616 12/26/19 1249  BP: 112/79 110/75  Pulse: 68 66  Resp: 18 16  Temp: 98 F (36.7 C) 97.9 F (36.6 C)  SpO2: 100% 100%   No intake/output data recorded.  Intake/Output Summary (Last 24 hours) at 12/26/2019 1447 Last data filed at 12/26/2019 8338 Gross per 24 hour  Intake 868.31 ml  Output 90 ml  Net 778.31 ml   Constitutional: well-appearing, no acute distress ENMT: ears and nose without scars or lesions, MMM CV: normal rate, no edema Respiratory: Bilateral chest rise, normal work of breathing Gastrointestinal: soft, bowel sounds present Skin: no visible lesions or rashes Psych: alert, judgement/insight appropriate, appropriate mood and affect   Test Results I personally reviewed new and old clinical labs and radiology tests Lab Results  Component Value Date   NA 133 (L) 12/26/2019   K 4.3 12/26/2019   CL 103 12/26/2019   CO2 18 (L) 12/26/2019   BUN 71 (H) 12/26/2019   CREATININE 6.48 (H) 12/26/2019   CALCIUM 8.5 (L) 12/26/2019   ALBUMIN 2.7 (L) 12/26/2019

## 2019-12-26 NOTE — TOC Initial Note (Signed)
Transition of Care [facility]) - Initial/Assessment Note    Patient Details  Name: Dillon Wolfe MRN: [id number] Date of Birth: December 30, 1970  [facility]) CM/SW Contact:    Lennart Pall, LCSW Phone Number: 12/26/2019, 3:14 PM  Clinical Narrative:   Met with pt today to review possible dc needs and financial/ medical follow up concerns.  Pt confirms that he lives with his 50 yo son in the home.  He is very focused on concerns about hospital bill and upcoming bills.  We did discuss lack of PCP and pt agreeable to this SW assist with referral to [facility] and [facility].  I have reached out to the financial counseling dept who have been in contact with pt during this admission [employer]) and stressed that he is still concerned and asking for assistance with Medicaid and SSD applications.  Ms. Wynetta Emery has already met with pt and does plan to review for possible Medicaid, however, there does not appear to be any long term disability to warrant a SSD app.  I have secured a "new patient" appointment for pt at the [facility] on 10/14 @ 9:30.  May need to assist with MATCH for medications at d/c.    Also, following for possible need for home IV abx.    Expected Discharge Plan: [place] Barriers to Discharge: Continued Medical Work up   Patient Goals and CMS Choice Patient states their goals for this hospitalization and ongoing recovery are:: "to go home but I'm gonna need help paying for all this"      Expected Discharge Plan and Services Expected Discharge Plan: [place] In-house Referral: Clinical Social Work     Living arrangements for the past 2 months: Single Family Home                                      Prior Living Arrangements/Services Living arrangements for the past 2 months: Single Family Home Lives with:: Self, Minor Children Patient language and need for interpreter  reviewed:: Yes Do you feel safe going back to the place where you live?: Yes      Need for Family Participation in Patient Care: No (Comment) Care giver support system in place?: No (comment)   Criminal Activity/Legal Involvement Pertinent to Current Situation/Hospitalization: No - Comment as needed  Activities of Daily Living Home Assistive Devices/Equipment: None ADL Screening (condition at time of admission) Patient's cognitive ability adequate to safely complete daily activities?: Yes Is the patient deaf or have difficulty hearing?: No Does the patient have difficulty seeing, even when wearing glasses/contacts?: No Does the patient have difficulty concentrating, remembering, or making decisions?: No Patient able to express need for assistance with ADLs?: Yes Does the patient have difficulty dressing or bathing?: No Independently performs ADLs?: Yes (appropriate for developmental age) Does the patient have difficulty walking or climbing stairs?: No Weakness of Legs: None Weakness of Arms/Hands: None  Permission Sought/Granted                  Emotional Assessment Appearance:: Appears stated age Attitude/Demeanor/Rapport: Engaged Affect (typically observed): Accepting, Frustrated Orientation: : Oriented to Self, Oriented to Place, Oriented to  Time, Oriented to Situation, Fluctuating Orientation (Suspected and/or reported Sundowners) Alcohol / Substance Use: Not Applicable Psych Involvement: No (comment)  Admission diagnosis:  Liver abscess [K75.0] Patient Active Problem List  Diagnosis Date Noted  . Liver abscess 12/21/2019   PCP:  Patient, No Pcp Per Pharmacy:   [id number] [place] [place] [address] [address]Phone: [phone]: [phone] [place] - [id number] [place] [id number]: [phone]: [phone]    Social  Determinants of Health (SDOH) Interventions    Readmission Risk Interventions No flowsheet data found.

## 2019-12-27 DIAGNOSIS — E119 Type 2 diabetes mellitus without complications: Secondary | ICD-10-CM

## 2019-12-27 DIAGNOSIS — K750 Abscess of liver: Secondary | ICD-10-CM

## 2019-12-27 DIAGNOSIS — N179 Acute kidney failure, unspecified: Secondary | ICD-10-CM

## 2019-12-27 LAB — CBC
HCT: 26.9 % — ABNORMAL LOW (ref 39.0–52.0)
Hemoglobin: 8.9 g/dL — ABNORMAL LOW (ref 13.0–17.0)
MCH: 25.1 pg — ABNORMAL LOW (ref 26.0–34.0)
MCHC: 33.1 g/dL (ref 30.0–36.0)
MCV: 76 fL — ABNORMAL LOW (ref 80.0–100.0)
Platelets: 305 10*3/uL (ref 150–400)
RBC: 3.54 MIL/uL — ABNORMAL LOW (ref 4.22–5.81)
RDW: 15.2 % (ref 11.5–15.5)
WBC: 19.8 10*3/uL — ABNORMAL HIGH (ref 4.0–10.5)
nRBC: 0 % (ref 0.0–0.2)

## 2019-12-27 LAB — COMPREHENSIVE METABOLIC PANEL
ALT: 73 U/L — ABNORMAL HIGH (ref 0–44)
AST: 52 U/L — ABNORMAL HIGH (ref 15–41)
Albumin: 2.5 g/dL — ABNORMAL LOW (ref 3.5–5.0)
Alkaline Phosphatase: 65 U/L (ref 38–126)
Anion gap: 12 (ref 5–15)
BUN: 73 mg/dL — ABNORMAL HIGH (ref 6–20)
CO2: 18 mmol/L — ABNORMAL LOW (ref 22–32)
Calcium: 8.5 mg/dL — ABNORMAL LOW (ref 8.9–10.3)
Chloride: 105 mmol/L (ref 98–111)
Creatinine, Ser: 6.72 mg/dL — ABNORMAL HIGH (ref 0.61–1.24)
GFR calc Af Amer: 10 mL/min — ABNORMAL LOW (ref >60)
GFR calc non Af Amer: 9 mL/min — ABNORMAL LOW (ref >60)
Glucose, Bld: 109 mg/dL — ABNORMAL HIGH (ref 70–99)
Potassium: 4.1 mmol/L (ref 3.5–5.1)
Sodium: 135 mmol/L (ref 135–145)
Total Bilirubin: 1.1 mg/dL (ref 0.3–1.2)
Total Protein: 6.9 g/dL (ref 6.5–8.1)

## 2019-12-27 MED ORDER — SODIUM CHLORIDE 0.9 % IV BOLUS
1000.0000 mL | Freq: Once | INTRAVENOUS | Status: AC
  Administered 2019-12-27: 1000 mL via INTRAVENOUS

## 2019-12-27 MED ORDER — SODIUM CHLORIDE 0.9 % IV SOLN: INTRAVENOUS | Status: DC

## 2019-12-27 NOTE — Progress Notes (Signed)
Inpatient Diabetes Program Recommendations  AACE/ADA: New Consensus Statement on Inpatient Glycemic Control (2015)  Target Ranges:  Prepandial:   less than 140 mg/dL      Peak postprandial:   less than 180 mg/dL (1-2 hours)      Critically ill patients:  140 - 180 mg/dL   Lab Results  Component Value Date   GLUCAP 195 (H) 12/21/2019   HGBA1C 7.6 (H) 12/24/2019    Review of Glycemic Control  Blood sugar 109 mg/dL this am. HgbA1C - 7.6% - New diagnosis DM CHO mod diet Needs PCP, med assistance as pt has no insurance coverage   Inpatient Diabetes Program Recommendations:     Add Novolog 0-9 units tidwc and hs  Met with pt at bedside to discuss new diagnosis of DM2. Discussed A1C results (7.6%) and explained what an A1C is and informed patient that his current A1C indicates an average glucose of 171 mg/dl over the past 2-3 months. Discussed basic pathophysiology of DM Type 2, basic home care, importance of checking CBGs and maintaining good CBG control to prevent long-term and short-term complications. Reviewed glucose and A1C goals and explained that patient will need to continue to  Reviewed signs and symptoms of hyperglycemia and hypoglycemia along with treatment for both. Discussed impact of nutrition, exercise, stress, sickness, and medications on diabetes control. Reviewed Living Well with diabetes booklet and encouraged patient to read through entire book.   Pt having a hard time understanding his diagnosis - explained that good blood sugar control is very important for healing.   Will follow-up for any further questions in am.  Thank you. Lorenda Peck, RD, LDN, CDE Inpatient Diabetes Coordinator [phone]

## 2019-12-27 NOTE — Progress Notes (Signed)
Nephrology Follow-Up Consult note   Summary: Dillon Wolfe is a/an 49 y.o. male with no past medical history admitted for liver abscess complicated by AKI  Assess/Plan: Non-Oliguric AKI: admit creat was 1.6, no old creatinine. Pt w/ ATN due to contrast+ dehydration + hypotension/ sepsis. Creat mid 6's , trying to recover I believe, up slightly today. No uremic signs/ symptoms. RUS showed normal kidneys, no obstruction. Wt's are down > will start back on IVF"s, he has no extra vol on exam. Cont supportive care.   Volume Status: as above, stable  Hypotension: resolving, BP's up in 110's now  Liver abscess/ bacteroides sepsis: wound cx growing several different bacteria. Currently on Zosyn monotherapy.    Leukocytosis improving.  Infectious disease and general surgery managing.  Anemia: Hemoglobin steady at 8.7 today.  Continue to monitor.  Kidney disease likely contributing minimally.  Hyperglycemia/Diabetes: Likely type 2 diabetes with hemoglobin A1c of 7.1. Will consult IM team for medical assistance.   Hypoalbuminemia: Improved to 2.5 today with supplementation.  Likely negative acute phase reactant.  Continue to monitor and repletion as above  Hyponatremia: Na up 135, resolving  NAGMA: bicarb 18, getting sodium bicarb 1300 mg 3 times daily.     Barbette Hair Aikam Vinje [facility] 12/27/2019 3:39 PM  ___________________________________________________________  CC: AKI  Interval History/Subjective: feeling okay, no c/o's, no n/v or abd pain. Started tracking UOP and 7 L recorded today?? This may be a mistake. Wt's are down though. Creat up slightly.    Medications:  Current Facility-Administered Medications  Medication Dose Route Frequency Provider Last Rate Last Admin  . 0.9 %  sodium chloride infusion   Intravenous Continuous Delano Metz, MD      . acetaminophen (TYLENOL) tablet 1,000 mg  1,000 mg Oral Q6H PRN Romie Levee, MD      . cefTRIAXone  (ROCEPHIN) 2 g in sodium chloride 0.9 % 100 mL IVPB  2 g Intravenous Q24H Ginnie Smart, MD   Stopped at 12/26/19 2017  . Chlorhexidine Gluconate Cloth 2 % PADS 6 each  6 each Topical Daily Drema Dallas, MD   6 each at 12/27/19 [phone]  . diphenhydrAMINE (BENADRYL) capsule 25 mg  25 mg Oral Q6H PRN Maczis, Elmer Sow, PA-C       Or  . diphenhydrAMINE (BENADRYL) injection 25 mg  25 mg Intravenous Q6H PRN Maczis, Elmer Sow, PA-C      . docusate sodium (COLACE) capsule 100 mg  100 mg Oral BID Romie Levee, MD   100 mg at 12/27/19 0915  . HYDROcodone-acetaminophen (NORCO/VICODIN) 5-325 MG per tablet 1-2 tablet  1-2 tablet Oral Q6H PRN Romie Levee, MD      . melatonin tablet 3 mg  3 mg Oral QHS Jacinto Halim, PA-C   3 mg at 12/26/19 2201  . metroNIDAZOLE (FLAGYL) tablet 500 mg  500 mg Oral Q8H Ginnie Smart, MD   500 mg at 12/27/19 0542  . morphine 2 MG/ML injection 2 mg  2 mg Intravenous Q2H PRN Romie Levee, MD      . ondansetron (ZOFRAN-ODT) disintegrating tablet 4 mg  4 mg Oral Q6H PRN Romie Levee, MD       Or  . ondansetron [facility]) injection 4 mg  4 mg Intravenous Q6H PRN Romie Levee, MD      . protein supplement (ENSURE MAX) liquid  11 oz Oral Daily Andria Meuse, MD   11 oz at 12/27/19 [phone]  . simethicone (MYLICON) chewable tablet 40  mg  40 mg Oral Q6H PRN Romie Levee, MD      . sodium bicarbonate tablet 1,300 mg  1,300 mg Oral TID Darnell Level, MD   1,300 mg at 12/27/19 0915  . sodium chloride flush (NS) 0.9 % injection 10-40 mL  10-40 mL Intracatheter PRN Drema Dallas, MD   10 mL at 12/27/19 0342  . sodium chloride flush (NS) 0.9 % injection 5 mL  5 mL Intracatheter Q8H Suttle, Thressa Sheller, MD   5 mL at 12/27/19 1402      Review of Systems: 10 systems reviewed and negative except per interval history/subjective  Physical Exam: Vitals:   12/26/19 2154 12/27/19 0700  BP: 122/89 120/84  Pulse: 71 86  Resp: 18 18  Temp: 97.9 F (36.6 C) 98 F  (36.7 C)  SpO2: 100% 99%   Total I/O In: 241.7 [IV Piggyback:241.7] Out: 15 [Drains:15]  Intake/Output Summary (Last 24 hours) at 12/27/2019 1539 Last data filed at 12/27/2019 1500 Gross per 24 hour  Intake 1171.67 ml  Output 8110 ml  Net -6938.33 ml   Constitutional: well-appearing, no acute distress ENMT: ears and nose without scars or lesions, MMM CV: normal rate, no edema Respiratory: Bilateral chest rise, normal work of breathing Gastrointestinal: soft, bowel sounds present Skin: no visible lesions or rashes Psych: alert, judgement/insight appropriate, appropriate mood and affect   Test Results I personally reviewed new and old clinical labs and radiology tests Lab Results  Component Value Date   NA 135 12/27/2019   K 4.1 12/27/2019   CL 105 12/27/2019   CO2 18 (L) 12/27/2019   BUN 73 (H) 12/27/2019   CREATININE 6.72 (H) 12/27/2019   CALCIUM 8.5 (L) 12/27/2019   ALBUMIN 2.5 (L) 12/27/2019

## 2019-12-27 NOTE — Consult Note (Signed)
Medical Consultation   Dillon Wolfe  ZOX:[id number]  DOB: 1971/01/20  DOA: 12/21/2019  PCP: Patient, No Pcp Per    Requesting physician: Dr. Arlean Hopping  Reason for consultation: Newly diagnosed diabetes  History of Present Illness: Dillon Wolfe is an 49 y.o. male with no known past medical history who presented to the ED on 9/15 with fevers and chills for several weeks which limited his ability to perform daily tasks.  He was also having intermittent diaphoresis, right-sided back pain and thirst.  Noted to have had weight loss due to poor p.o. intake and intermittent diarrhea, nausea and vomiting.  On admission he was found to have a large posterior liver abscess and it was thought this might be coming from his gallbladder due to ulceration.  He underwent drain placement on 9/16.  ID was consulted for antibiotic management.  Of note his creatinine was 1.7 on arrival which increased to 5.8 on 9/18 and nephrology was consulted.  Additionally, on 9/18 his HbA1c was 7.6 and he was diagnosed with diabetes at the time.  Glucose has remained stable during his hospitalization.  This newly diagnosed diabetes prompted this internal medicine consult on 9/21.  Today he and his family member at the bedside state they have been confused and a little overwhelmed with all the different moving parts of his hospital stay.  He expressed a significant concern about his diabetes and his need for lifestyle change and expressed a strong desire to adhere to lifestyle modifications but did not know where to start.  We had a nice long discussion regarding his entire hospital stay and I was able to answer his questions and smooth out his concerns.  He was concerned about what diet to adhere to once he is discharged.  Additionally, he did not understand how he was diagnosed with diabetes but his sugars have all been good since he has been here.  I explained to him the difference between type I and type  2 diabetes and explained to him different ways to diagnose diabetes which include but are not limited to daily glucose readings but also include hemoglobin A1c and that this is a measure of his glucose over a 51-month period of time which is how he was diagnosed.  All questions were answered and the patient felt reassured and appreciative of our conversation.  He denies any chest pain, nausea, vomiting, diarrhea, abdominal pain, shortness of breath or any other complaints at this time  Review of Systems:  Review of Systems  All other systems reviewed and are negative.  As per HPI otherwise 10 point review of systems negative.    Past Medical History: History reviewed. No pertinent past medical history.  Past Surgical History: History reviewed. No pertinent surgical history.   Allergies:  No Known Allergies   Social History:  reports that he has been smoking cigars. He has never used smokeless tobacco. He reports previous alcohol use. He reports that he does not use drugs.   Family History: Family History  Problem Relation Age of Onset  . Obesity Mother   . Heart failure Mother   . Hypertension Mother   . Cancer Father      Physical Exam: Vitals:   12/26/19 0616 12/26/19 1249 12/26/19 2154 12/27/19 0700  BP: 112/79 110/75 122/89 120/84  Pulse: 68 66 71 86  Resp: Temp: 98 F (36.7 C) 97.9 F (36.6  C) 97.9 F (36.6 C) 98 F (36.7 C)  TempSrc: Oral  Oral Oral  SpO2: 100% 100% 100% 99%  Weight:      Height:        Constitutional: Alert and awake, oriented x3, not in any acute distress. Eyes: PERLA, EOMI, irises appear normal, anicteric sclera,  ENMT: external ears and nose appear norma            Lips appears normal, oropharynx mucosa normal Neck: neck appears normal, no masses, normal ROM, no thyromegaly, no JVD  CVS: S1-S2 clear, no murmur rubs or gallops, no LE edema, normal pedal pulses  Respiratory:  clear to auscultation bilaterally, no wheezing,  rales or rhonchi. Respiratory effort normal. No accessory muscle use.  Abdomen: soft nontender, nondistended, normal bowel sounds, no hepatosplenomegaly, no hernias  Musculoskeletal: : no cyanosis, clubbing or edema noted bilaterally Psych: judgement and insight appear normal, stable mood and affect, mental status Skin: no rashes or lesions or ulcers, no induration or nodules   Data reviewed:  I have personally reviewed following labs and imaging studies Labs:  CBC: Recent Labs  Lab 12/21/19 1300 12/22/19 0310 12/24/19 0329 12/25/19 0306 12/27/19 0341  WBC 22.2* 7.9 22.7* 15.5* 19.8*  NEUTROABS  --   --  19.0*  --   --   HGB 11.1* 12.1* 8.9* 8.7* 8.9*  HCT 32.7* 39.0 26.8* 25.6* 26.9*  MCV 75.0* 80.6 76.4* 76.0* 76.0*  PLT 289 237 164 185 305    Basic Metabolic Panel: Recent Labs  Lab 12/22/19 0310 12/22/19 0310 12/24/19 0329 12/24/19 0329 12/25/19 0306 12/25/19 0306 12/26/19 0332 12/27/19 0341  NA 135  --  133*  --  132*  --  133* 135  K 3.9   < > 4.2   < > 4.1   < > 4.3 4.1  CL 100  --  104  --  104  --  103 105  CO2 14*  --  17*  --  17*  --  18* 18*  GLUCOSE 128*  --  159*  --  119*  --  117* 109*  BUN 25*  --  67*  --  68*  --  71* 73*  CREATININE 1.67*  --  5.78*  --  6.25*  --  6.48* 6.72*  CALCIUM 8.6*  --  8.2*  --  7.9*  --  8.5* 8.5*   < > = values in this interval not displayed.   GFR Estimated Creatinine Clearance: 15.3 mL/min (A) (by C-G formula based on SCr of 6.72 mg/dL (H)). Liver Function Tests: Recent Labs  Lab 12/21/19 1300 12/24/19 0329 12/25/19 0306 12/26/19 0332 12/27/19 0341  AST 79* 105* 62* 61* 52*  ALT 75* 128* 89* 81* 73*  ALKPHOS 111 80 64 71 65  BILITOT 2.2* 1.6* 1.1 1.5* 1.1  PROT 8.6* 6.8 6.3* 7.2 6.9  ALBUMIN 2.5* 1.8* 2.1* 2.7* 2.5*   No results for input(s): LIPASE, AMYLASE in the last 168 hours. No results for input(s): AMMONIA in the last 168 hours. Coagulation profile Recent Labs  Lab 12/21/19 1420  INR 1.3*     Cardiac Enzymes: No results for input(s): CKTOTAL, CKMB, CKMBINDEX, TROPONINI in the last 168 hours. BNP: Invalid input(s): POCBNP CBG: Recent Labs  Lab 12/21/19 1059  GLUCAP 195*   D-Dimer No results for input(s): DDIMER in the last 72 hours. Hgb A1c No results for input(s): HGBA1C in the last 72 hours. Lipid Profile No results for input(s): CHOL, HDL, LDLCALC,  TRIG, CHOLHDL, LDLDIRECT in the last 72 hours. Thyroid function studies No results for input(s): TSH, T4TOTAL, T3FREE, THYROIDAB in the last 72 hours.  Invalid input(s): FREET3 Anemia work up No results for input(s): VITAMINB12, FOLATE, FERRITIN, TIBC, IRON, RETICCTPCT in the last 72 hours. Urinalysis    Component Value Date/Time   COLORURINE YELLOW 12/24/2019 1246   APPEARANCEUR CLEAR 12/24/2019 1246   LABSPEC 1.012 12/24/2019 1246   PHURINE 5.0 12/24/2019 1246   GLUCOSEU NEGATIVE 12/24/2019 1246   HGBUR MODERATE (A) 12/24/2019 1246   BILIRUBINUR NEGATIVE 12/24/2019 1246   KETONESUR NEGATIVE 12/24/2019 1246   PROTEINUR 30 (A) 12/24/2019 1246   NITRITE NEGATIVE 12/24/2019 1246   LEUKOCYTESUR NEGATIVE 12/24/2019 1246     Microbiology Recent Results (from the past 240 hour(s))  Urine culture     Status: None   Collection Time: 12/21/19  2:20 PM   Specimen: In/Out Cath Urine  Result Value Ref Range Status   Specimen Description   Final    IN/OUT CATH URINE Performed at [facility], [address] [place], [place] [id number]    Special Requests   Final    NONE Performed at [facility], [address] [place], [place] [id number]    Culture   Final    NO GROWTH Performed at [facility], [address] [place], [place] [address]    Report Status 12/22/2019 FINAL  Final  Blood Culture (routine x 2)     Status: Abnormal   Collection Time: 12/21/19  2:20 PM   Specimen: BLOOD RIGHT HAND  Result Value Ref Range Status   Specimen Description   Final     BLOOD RIGHT HAND Performed at [facility], [address] [place], [place] [address]    Special Requests   Final    BOTTLES DRAWN AEROBIC AND ANAEROBIC Blood Culture adequate volume Performed at [facility], [address] [place], [place] [address]    Culture  Setup Time   Final    GRAM NEGATIVE RODS ANAEROBIC BOTTLE ONLY Organism ID to follow CRITICAL RESULT CALLED TO, READ BACK BY AND VERIFIED WITH: J. LEGGE PHARMD, AT 1310 12/23/19 BY D. VANHOOK    Culture (A)  Final    BACTEROIDES FRAGILIS BETA LACTAMASE POSITIVE Performed at [facility], [address] [place], [place] [address]    Report Status 12/25/2019 FINAL  Final  Blood Culture (routine x 2)     Status: None   Collection Time: 12/21/19  2:20 PM   Specimen: BLOOD  Result Value Ref Range Status   Specimen Description   Final    BLOOD LEFT ANTECUBITAL Performed at [facility], [address] [place], [place] [address]    Special Requests   Final    BOTTLES DRAWN AEROBIC AND ANAEROBIC Blood Culture adequate volume Performed at [facility], [address] [place], [place] [address]    Culture   Final    NO GROWTH 5 DAYS Performed at [facility], [address] [place], [place] [address]    Report Status 12/26/2019 FINAL  Final  SARS Coronavirus 2 by RT PCR (hospital order, performed in [facility] hospital lab) Nasopharyngeal Nasopharyngeal Swab     Status: None   Collection Time: 12/21/19  2:20 PM   Specimen: Nasopharyngeal Swab  Result Value Ref Range Status   SARS Coronavirus 2 NEGATIVE NEGATIVE Final    Comment: (NOTE) SARS-CoV-2  target nucleic acids are NOT DETECTED.  The SARS-CoV-2 RNA is generally detectable in upper and lower respiratory specimens during the acute phase of infection. The lowest concentration of SARS-CoV-2 viral copies this assay can detect is 250 copies / mL. A negative result does not preclude  SARS-CoV-2 infection and should not be used as the sole basis for treatment or other patient management decisions.  A negative result may occur with improper specimen collection / handling, submission of specimen other than nasopharyngeal swab, presence of viral mutation(s) within the areas targeted by this assay, and inadequate number of viral copies (<250 copies / mL). A negative result must be combined with clinical observations, patient history, and epidemiological information.  Fact Sheet for Patients:   BoilerBrush.com.cy  Fact Sheet for Healthcare Providers: https://pope.com/  This test is not yet approved or  cleared by the [place] FDA and has been authorized for detection and/or diagnosis of SARS-CoV-2 by FDA under an Emergency Use Authorization (EUA).  This EUA will remain in effect (meaning this test can be used) for the duration of the COVID-19 declaration under Section 564(b)(1) of the Act, 21 U.S.C. section 360bbb-3(b)(1), unless the authorization is terminated or revoked sooner.  Performed at [facility], [address] [place], [place] [address]   Blood Culture ID Panel (Reflexed)     Status: Abnormal   Collection Time: 12/21/19  2:20 PM  Result Value Ref Range Status   Enterococcus faecalis NOT DETECTED NOT DETECTED Final   Enterococcus Faecium NOT DETECTED NOT DETECTED Final   Listeria monocytogenes NOT DETECTED NOT DETECTED Final   Staphylococcus species NOT DETECTED NOT DETECTED Final   Staphylococcus aureus (BCID) NOT DETECTED NOT DETECTED Final   Staphylococcus epidermidis NOT DETECTED NOT DETECTED Final   Staphylococcus lugdunensis NOT DETECTED NOT DETECTED Final   Streptococcus species NOT DETECTED NOT DETECTED Final   Streptococcus agalactiae NOT DETECTED NOT DETECTED Final   Streptococcus pneumoniae NOT DETECTED NOT DETECTED Final   Streptococcus pyogenes NOT DETECTED NOT  DETECTED Final   A.calcoaceticus-baumannii NOT DETECTED NOT DETECTED Final   Bacteroides fragilis DETECTED (A) NOT DETECTED Final    Comment: CRITICAL RESULT CALLED TO, READ BACK BY AND VERIFIED WITH: J. LEGGE PHARMD, AT 1310 12/23/19 BY D. VANHOOK    Enterobacterales NOT DETECTED NOT DETECTED Final   Enterobacter cloacae complex NOT DETECTED NOT DETECTED Final   Escherichia coli NOT DETECTED NOT DETECTED Final   Klebsiella aerogenes NOT DETECTED NOT DETECTED Final   Klebsiella oxytoca NOT DETECTED NOT DETECTED Final   Klebsiella pneumoniae NOT DETECTED NOT DETECTED Final   Proteus species NOT DETECTED NOT DETECTED Final   Salmonella species NOT DETECTED NOT DETECTED Final   Serratia marcescens NOT DETECTED NOT DETECTED Final   Haemophilus influenzae NOT DETECTED NOT DETECTED Final   Neisseria meningitidis NOT DETECTED NOT DETECTED Final   Pseudomonas aeruginosa NOT DETECTED NOT DETECTED Final   Stenotrophomonas maltophilia NOT DETECTED NOT DETECTED Final   Candida albicans NOT DETECTED NOT DETECTED Final   Candida auris NOT DETECTED NOT DETECTED Final   Candida glabrata NOT DETECTED NOT DETECTED Final   Candida krusei NOT DETECTED NOT DETECTED Final   Candida parapsilosis NOT DETECTED NOT DETECTED Final   Candida tropicalis NOT DETECTED NOT DETECTED Final   Cryptococcus neoformans/gattii NOT DETECTED NOT DETECTED Final    Comment: Performed at [facility], [address], [place], [place] [address]  Aerobic/Anaerobic Culture (surgical/deep wound)     Status: None   Collection  Time: 12/23/19 11:12 AM   Specimen: Abdomen; Abscess  Result Value Ref Range Status   Specimen Description   Final    ABDOMEN Performed at [facility], [address], [place], [place] [address]    Special Requests   Final    Normal Performed at [facility], [address], [place], [place] [address]    Gram Stain   Final    FEW WBC PRESENT,BOTH PMN AND  MONONUCLEAR ABUNDANT GRAM POSITIVE COCCI FEW GRAM VARIABLE ROD    Culture   Final    RARE ESCHERICHIA COLI MODERATE STREPTOCOCCUS CONSTELLATUS RARE BACTEROIDES FRAGILIS RARE BACTEROIDES THETAIOTAOMICRON BETA LACTAMASE POSITIVE Performed at [facility], [address] [place], [place] [address]    Report Status 12/26/2019 FINAL  Final   Organism ID, Bacteria ESCHERICHIA COLI  Final      Susceptibility   Escherichia coli - MIC*    AMPICILLIN >=32 RESISTANT Resistant     CEFAZOLIN <=4 SENSITIVE Sensitive     CEFEPIME <=0.12 SENSITIVE Sensitive     CEFTAZIDIME <=1 SENSITIVE Sensitive     CEFTRIAXONE <=0.25 SENSITIVE Sensitive     CIPROFLOXACIN <=0.25 SENSITIVE Sensitive     GENTAMICIN <=1 SENSITIVE Sensitive     IMIPENEM <=0.25 SENSITIVE Sensitive     TRIMETH/SULFA <=20 SENSITIVE Sensitive     AMPICILLIN/SULBACTAM >=32 RESISTANT Resistant     PIP/TAZO <=4 SENSITIVE Sensitive     * RARE ESCHERICHIA COLI  Culture, blood (Routine X 2) w Reflex to ID Panel     Status: None (Preliminary result)   Collection Time: 12/23/19  5:06 PM   Specimen: BLOOD LEFT HAND  Result Value Ref Range Status   Specimen Description   Final    BLOOD LEFT HAND Performed at [facility], [address] [place], [place] [address]    Special Requests   Final    BOTTLES DRAWN AEROBIC AND ANAEROBIC Blood Culture adequate volume Performed at [facility], [address], [place], [place] [address]    Culture   Final    NO GROWTH 4 DAYS Performed at [facility], [address], [place], [place] [address]    Report Status PENDING  Incomplete       Inpatient Medications:   Scheduled Meds: . Chlorhexidine Gluconate Cloth  6 each Topical Daily  . docusate sodium  100 mg Oral BID  . melatonin  3 mg Oral QHS  . metroNIDAZOLE  500 mg Oral Q8H  . Ensure Max Protein  11 oz Oral Daily  . sodium bicarbonate  1,300 mg Oral TID  . sodium chloride flush  5  mL Intracatheter Q8H   Continuous Infusions: . sodium chloride 125 mL/hr at 12/27/19 1542  . cefTRIAXone (ROCEPHIN)  IV Stopped (12/26/19 2017)     Radiological Exams on Admission: No results found.  Impression/Recommendations Active Problems:   Liver abscess  1. Newly diagnosed diabetes, likely Type 2 1. HbA1c 7.6 on 9/18 2. I would not start him on any medications for now since his glucose has been well controlled while hospitalized but rather encouraged lifestyle modification at discharge, which he seems very willing to adhere to. 3. As his renal function improves his glucose may start to trend up and if this occurs then you can start on a sensitive sliding scale, however giving insulin now may cause him to become hypoglycemic 4. Continue carb modified diet 5. Dietician consult for dietary education 6. Diabetic coordinator  consulted 7. Social work consult to help patient establish with a PCP at discharge 8. If his renal function improves then he can likely start on Metformin as an outpatient 9. He has asked to see the evaluated by the physical therapist, orders placed  2. Bacteroides fragilis bacteremia 1. Management per ID   3. Perforated appendix with Streptococcus constellatus, E. coli liver abscess s/p IR PERC drain 12/23/2019 1. Management per general surgery and ID  4. AKI likely secondary to ATN from hypotension, dehydration, contrast from CT scan 1. Management per nephrology  5. Hypotension, resolved    Thank you for this consultation.  TRH will sign off for now.  Please do not hesitate to reach back out with any questions or concerns   Time Spent: 65 minutes  Jae Dire M.D. [facility] 12/27/2019, 3:59 PM

## 2019-12-27 NOTE — Progress Notes (Signed)
Subjective: Patient frustrated with the kidney issue.  Ready to go home.  Tolerating his diet well.  Glucose 109 this am.  Voided 7L apparently yesterday per I/Os  ROS: See above, otherwise other systems negative  Objective: Vital signs in last 24 hours: Temp:  [97.9 F (36.6 C)-98 F (36.7 C)] 98 F (36.7 C) (09/21 0700) Pulse Rate:  [66-86] 86 (09/21 0700) Resp:  [16-18] 18 (09/21 0700) BP: (110-122)/(75-89) 120/84 (09/21 0700) SpO2:  [99 %-100 %] 99 % (09/21 0700) Last BM Date: 12/26/19  Intake/Output from previous day: 09/20 0701 - 09/21 0700 In: 935 [P.O.:820; I.V.:10; IV Piggyback:100] Out: 8095 [Urine:7900; Drains:195] Intake/Output this shift: No intake/output data recorded.  PE: Heart: regular Lungs: CTAB Abd: soft, NT except sore around drain, drain with minimal yellow/serous type output present, put out 195 yesterday, +BS, ND  Lab Results:  Recent Labs    12/25/19 0306 12/27/19 0341  WBC 15.5* 19.8*  HGB 8.7* 8.9*  HCT 25.6* 26.9*  PLT 185 305   BMET Recent Labs    12/26/19 0332 12/27/19 0341  NA 133* 135  K 4.3 4.1  CL 103 105  CO2 18* 18*  GLUCOSE 117* 109*  BUN 71* 73*  CREATININE 6.48* 6.72*  CALCIUM 8.5* 8.5*   PT/INR No results for input(s): LABPROT, INR in the last 72 hours. CMP     Component Value Date/Time   NA 135 12/27/2019 0341   K 4.1 12/27/2019 0341   CL 105 12/27/2019 0341   CO2 18 (L) 12/27/2019 0341   GLUCOSE 109 (H) 12/27/2019 0341   BUN 73 (H) 12/27/2019 0341   CREATININE 6.72 (H) 12/27/2019 0341   CALCIUM 8.5 (L) 12/27/2019 0341   PROT 6.9 12/27/2019 0341   ALBUMIN 2.5 (L) 12/27/2019 0341   AST 52 (H) 12/27/2019 0341   ALT 73 (H) 12/27/2019 0341   ALKPHOS 65 12/27/2019 0341   BILITOT 1.1 12/27/2019 0341   GFRNONAA 9 (L) 12/27/2019 0341   GFRAA 10 (L) 12/27/2019 0341   Lipase  No results found for: LIPASE     Studies/Results: No results found.  Anti-infectives: Anti-infectives (From admission,  onward)   Start     Dose/Rate Route Frequency Ordered Stop   12/26/19 2000  cefTRIAXone (ROCEPHIN) 2 g in sodium chloride 0.9 % 100 mL IVPB        2 g 200 mL/hr over 30 Minutes Intravenous Every 24 hours 12/26/19 1538     12/26/19 2000  metroNIDAZOLE (FLAGYL) tablet 500 mg        500 mg Oral Every 8 hours 12/26/19 1538     12/24/19 0600  piperacillin-tazobactam (ZOSYN) IVPB 2.25 g  Status:  Discontinued        2.25 g 100 mL/hr over 30 Minutes Intravenous Every 8 hours 12/24/19 0522 12/24/19 0524   12/24/19 0600  piperacillin-tazobactam (ZOSYN) IVPB 2.25 g  Status:  Discontinued        2.25 g 100 mL/hr over 30 Minutes Intravenous Every 6 hours 12/24/19 0524 12/26/19 1538   12/21/19 2200  piperacillin-tazobactam (ZOSYN) IVPB 3.375 g  Status:  Discontinued        3.375 g 12.5 mL/hr over 240 Minutes Intravenous Every 8 hours 12/21/19 2110 12/24/19 0522   12/21/19 1430  piperacillin-tazobactam (ZOSYN) IVPB 3.375 g        3.375 g 100 mL/hr over 30 Minutes Intravenous  Once 12/21/19 1418 12/21/19 1527       Assessment/Plan AKI -  appreciate nephrology following, Cr up to 6.7 today  Likely type 2 DM - A1c 7.6, will need follow up with a PCP for this, [place] and wellness appt set up   Bacteroides fragilis bacteremia RUQ abscess  - afebrile, VSS, WBC 19 today - DDx: perforated gallbladder w/ intraperitoneal gallstone vs perforated appendicitis  - S/P IR perc drain 12/23/19, continue drain; cultures w/ E.coli, strep constellatus resistant to ampicillin  - appreciate ID consult, IV Rocephin and Flagyl.  Will go home with a stop date of 10/6 -follow WBC and await clearance from neph regarding kidney function prior to discharge  FEN: Reg diet  ID: Zosyn 9/15 >> 9/20, Rocephin/Flagyl 9/20 --> VTE: Lovenox  Foley: None   LOS: 6 days    Letha Cape , [facility] 12/27/2019, 9:53 AM Please see Amion for pager number during day hours 7:00am-4:30pm or 7:00am  -11:30am on weekends

## 2019-12-27 NOTE — Plan of Care (Signed)

## 2019-12-27 NOTE — Progress Notes (Addendum)
Referring Physician(s): C White Supervising Physician: Oley Balm  Patient Status:  [facility] - In-pt  Chief Complaint:  RUQ intra hepatic abscess s/p drain placement on 9.17.21 by Dr. Elby Showers  Subjective:  Patient is frustrated being in the hospital. Patient states that his pain in unchanged since admission and subsequent drain placement. Patient alert and laying in bed, calm. Denies any nausea, vomiting.    Allergies: Patient has no known allergies.  Medications: Prior to Admission medications   Medication Sig Start Date End Date Taking? Authorizing Provider  ibuprofen (ADVIL,MOTRIN) 800 MG tablet Take 1 tablet (800 mg total) by mouth 3 (three) times daily. Patient taking differently: Take 800 mg by mouth in the morning.  09/24/17  Yes Melene Plan, DO  HYDROcodone-acetaminophen (NORCO/VICODIN) 5-325 MG per tablet 1-2 tablets po q 6 hours prn moderate to severe pain Patient not taking: Reported on 12/21/2019 03/01/12   Quita Skye, MD     Vital Signs: BP 120/84 (BP Location: Right Arm)   Pulse 86   Temp 98 F (36.7 C) (Oral)   Resp 18   Ht 5\' 8"  (1.727 m)   Wt 225 lb 15.5 oz (102.5 kg)   SpO2 99%   BMI 34.36 kg/m   Physical Exam Vitals and nursing note reviewed.  Constitutional:      Appearance: He is well-developed.  HENT:     Head: Normocephalic.  Pulmonary:     Effort: Pulmonary effort is normal.  Abdominal:     Comments: Positive RUQ drain  to  gravity bag. site is unremarkable with no erythema, edema, tenderness, bleeding or drainage noted at exit site. Suture and stat lock in place. Dressing is clean dry and intact. 20 ml of  Yellow turbid  colored fluid noted in gravity device. Drain is able to be flushed easily.  Musculoskeletal:        General: Normal range of motion.     Cervical back: Normal range of motion.  Skin:    General: Skin is dry.  Neurological:     Mental Status: He is alert and oriented to person, place, and time.     Imaging:  RENAL  Result Date: 12/24/2019 CLINICAL DATA:  Acute renal insufficiency. EXAM: RENAL / URINARY TRACT ULTRASOUND COMPLETE COMPARISON:  CT 12/21/2019 FINDINGS: Right Kidney: Renal measurements: 11.7 x 6.2 x 6.1 cm = volume: 230 mL. Echogenicity within normal limits. No mass or hydronephrosis visualized. Left Kidney: Renal measurements: 12.5 x 6.3 x 6.0 cm = volume: 249 mL. Echogenicity within normal limits. No mass or hydronephrosis visualized. Bladder: Appears normal for degree of bladder distention. Bilateral ureteral jets visualized. Other: None. IMPRESSION: Normal size kidneys without hydronephrosis. Electronically Signed   By: 12/23/2019 M.D.   On: 12/24/2019 12:31   CT IMAGE GUIDED DRAINAGE BY PERCUTANEOUS CATHETER  Result Date: 12/23/2019 INDICATION: 49 year old male with intra-abdominal/intrahepatic fluid collection concerning for abscess. EXAM: CT-guided drain placement MEDICATIONS: The patient is currently admitted to the hospital and receiving intravenous antibiotics. The antibiotics were administered within an appropriate time frame prior to the initiation of the procedure. ANESTHESIA/SEDATION: Fentanyl 50 mcg IV; Versed 2 mg IV Moderate Sedation Time:  22 The patient was continuously monitored during the procedure by the interventional radiology nurse under my direct supervision. COMPLICATIONS: None immediate. PROCEDURE: Informed written consent was obtained from the patient after a thorough discussion of the procedural risks, benefits and alternatives. All questions were addressed. Maximal Sterile Barrier Technique was utilized including caps, mask, sterile gowns, sterile  gloves, sterile drape, hand hygiene and skin antiseptic. A timeout was performed prior to the initiation of the procedure. The patient's right upper quadrant was prepped and draped in standard fashion. Planning CT demonstrated similar appearance of previously visualized intrahepatic multi loculated fluid collection containing  an approximately 1.3 cm radiopaque calculus in the periphery. There are scattered air-fluid levels. The procedure was planned. A small amount of local anesthesia was provided at the skin entry site with 1% lidocaine with epinephrine. Deeper local anesthesia was provided along the planned drain entry path. An 18 gauge trocar needle was advanced under intermittent CT fluoroscopy into the fluid collection. Immediately, there was peroneal aunt, foul-smelling aspirate from the initial needle placement. An Amplatz wire was coiled within the fluid collection. Serial dilation was performed with 8 and 12 [place] dilators. A 12 French pigtail drainage catheter was advanced into the fluid collection in the pigtail portion with locked. Approximately 200 mL of purulence aspirate were removed and the drain was then placed to bag drainage. The samples were sent to the lab for culture. The patient tolerated the procedure well without immediate complication. IMPRESSION: Successful CT-guided abdominal/intrahepatic drain placement. Preprocedure CT demonstrates fluid collection originating at the tip of the appendix. Calculus within the fluid collection likely represents appendicoliths. The gallbladder appears minimally inflamed, likely reactive, but intact and separate from the fluid collection, suggesting etiology of fluid collection is secondary to perforated appendicitis. Electronically Signed   By: Marliss Coots MD   On: 12/23/2019 13:02    Labs:  CBC: Recent Labs    12/22/19 0310 12/24/19 0329 12/25/19 0306 12/27/19 0341  WBC 7.9 22.7* 15.5* 19.8*  HGB 12.1* 8.9* 8.7* 8.9*  HCT 39.0 26.8* 25.6* 26.9*  PLT 237 164 185 305    COAGS: Recent Labs    12/21/19 1420  INR 1.3*  APTT 45*    BMP: Recent Labs    12/24/19 0329 12/25/19 0306 12/26/19 0332 12/27/19 0341  NA 133* 132* 133* 135  K 4.2 4.1 4.3 4.1  CL 104 104 103 105  CO2 17* 17* 18* 18*  GLUCOSE 159* 119* 117* 109*  BUN 67* 68* 71* 73*    CALCIUM 8.2* 7.9* 8.5* 8.5*  CREATININE 5.78* 6.25* 6.48* 6.72*  GFRNONAA 10* 10* 9* 9*  GFRAA 12* 11* 11* 10*    LIVER FUNCTION TESTS: Recent Labs    12/24/19 0329 12/25/19 0306 12/26/19 0332 12/27/19 0341  BILITOT 1.6* 1.1 1.5* 1.1  AST 105* 62* 61* 52*  ALT 128* 89* 81* 73*  ALKPHOS 80 64 71 65  PROT 6.8 6.3* 7.2 6.9  ALBUMIN 1.8* 2.1* 2.7* 2.5*    Assessment and Plan: 49 y.o.  male inpatient . Unremarkable medical history. Presented to the ED at [facility] with abdominal pain, nausea and chills. CT abdomen pelvis from 9.15.21 shows a large liver abscess and new onset of diabetes.  On 9.17.21 IR placed an abscess drain in the intra - abdominal intra hepatic fluid collection. Per EPIC output is:  195 ml, 105 ml, 100 ml, 105 ml. Positive RUQ drain  to  gravity bag. site is unremarkable with no erythema, edema, tenderness, bleeding or drainage noted at exit site. Suture and stat lock in place. Dressing is clean dry and intact. 20 ml of  Yellow turbid  colored fluid noted in gravity device. Drain is able to be flushed easily.  No pertinent imaging since drain placement. WBC is 19.5, BUN 73, Cr 6.75, AST 52, ALT 73. lture from abscess shows  rare escherichia coli. All other labs are within acceptable parameters. NKDA.   Recommend team continue with flushing TID, output recording q shift and dressing changes as needed. Would consider additional imaging when output is less than 10 ml for 24 hours not including flush material.   Continue current treatment plans as per CCS and ID   Electronically Signed: Alene Mires, NP 12/27/2019, 10:46 AM   I spent a total of  at the patient's bedside AND on the patient's hospital floor or unit, greater than 50% of which was counseling/coordinating care for intra hepatic fluid collection.

## 2019-12-27 NOTE — TOC Progression Note (Signed)
Transition of Care [facility]) - Progression Note    Patient Details  Name: Creedon Danielski MRN: [id number] Date of Birth: Aug 17, 1970  [facility] of [facility]) CM/SW Contact  Amada Jupiter, LCSW Phone Number: 12/27/2019, 1:47 PM  Clinical Narrative:    Referrals have been secured with [facility] for IV abx and with [facility] for [facility] coverage. TOC will continue to follow for any additional needs.   Expected Discharge Plan: Home w Home Health Services Barriers to Discharge: Continued Medical Work up  Expected Discharge Plan and Services Expected Discharge Plan: Home w Home Health Services In-house Referral: Clinical Social Work     Living arrangements for the past 2 months: Single Family Home                 DME Arranged: N/A DME Agency: NA       HH Arranged: IV Antibiotics, RN HH Agency: [facility] Date HH Agency Contacted: 12/27/19 Time HH Agency Contacted: 1300 Representative spoke with at [facility] Agency: Kandee Keen   Social Determinants of Health (SDOH) Interventions    Readmission Risk Interventions No flowsheet data found.

## 2019-12-28 LAB — CBC
HCT: 26.5 % — ABNORMAL LOW (ref 39.0–52.0)
Hemoglobin: 8.6 g/dL — ABNORMAL LOW (ref 13.0–17.0)
MCH: 25.2 pg — ABNORMAL LOW (ref 26.0–34.0)
MCHC: 32.5 g/dL (ref 30.0–36.0)
MCV: 77.7 fL — ABNORMAL LOW (ref 80.0–100.0)
Platelets: 364 10*3/uL (ref 150–400)
RBC: 3.41 MIL/uL — ABNORMAL LOW (ref 4.22–5.81)
RDW: 15.5 % (ref 11.5–15.5)
WBC: 16.9 10*3/uL — ABNORMAL HIGH (ref 4.0–10.5)
nRBC: 0 % (ref 0.0–0.2)

## 2019-12-28 LAB — BASIC METABOLIC PANEL
Anion gap: 12 (ref 5–15)
BUN: 71 mg/dL — ABNORMAL HIGH (ref 6–20)
CO2: 18 mmol/L — ABNORMAL LOW (ref 22–32)
Calcium: 8.1 mg/dL — ABNORMAL LOW (ref 8.9–10.3)
Chloride: 106 mmol/L (ref 98–111)
Creatinine, Ser: 5.79 mg/dL — ABNORMAL HIGH (ref 0.61–1.24)
GFR calc Af Amer: 12 mL/min — ABNORMAL LOW (ref >60)
GFR calc non Af Amer: 10 mL/min — ABNORMAL LOW (ref >60)
Glucose, Bld: 99 mg/dL (ref 70–99)
Potassium: 4 mmol/L (ref 3.5–5.1)
Sodium: 136 mmol/L (ref 135–145)

## 2019-12-28 LAB — CULTURE, BLOOD (ROUTINE X 2)
Culture: NO GROWTH
Special Requests: ADEQUATE

## 2019-12-28 NOTE — Progress Notes (Signed)
Referring Physician(s): Dr. Cliffton Asters  Supervising Physician: Malachy Moan  Patient Status:  [facility] - In-pt  Chief Complaint: RUQ intra hepatic abscess s/p drain placement 12/23/19  Subjective: Patient in bed, he appears relaxed/comfortable. He states he doesn't have any pain or discomfort.  Allergies: Patient has no known allergies.  Medications: Prior to Admission medications   Medication Sig Start Date End Date Taking? Authorizing Provider  ibuprofen (ADVIL,MOTRIN) 800 MG tablet Take 1 tablet (800 mg total) by mouth 3 (three) times daily. Patient taking differently: Take 800 mg by mouth in the morning.  09/24/17  Yes Melene Plan, DO  HYDROcodone-acetaminophen (NORCO/VICODIN) 5-325 MG per tablet 1-2 tablets po q 6 hours prn moderate to severe pain Patient not taking: Reported on 12/21/2019 03/01/12   Quita Skye, MD     Vital Signs: BP 123/66 (BP Location: Right Arm)    Pulse 81    Temp 98.8 F (37.1 C)    Resp 18    Ht 5\' 8"  (1.727 m)    Wt 225 lb 15.5 oz (102.5 kg)    SpO2 100%    BMI 34.36 kg/m   Physical Exam Constitutional:      General: He is not in acute distress. Pulmonary:     Effort: Pulmonary effort is normal.  Abdominal:     Palpations: Abdomen is soft.     Tenderness: There is no abdominal tenderness.     Comments: RUQ drain intact. Dressing is clean and dry. Drain easily flushed with 5 cc NS. Approx. 25 cc fluid in gravity bag. No pain/tenderness to palpation.   Skin:    General: Skin is warm and dry.  Neurological:     Mental Status: He is alert and oriented to person, place, and time.     Imaging: No results found.  Labs:  CBC: Recent Labs    12/24/19 0329 12/25/19 0306 12/27/19 0341 12/28/19 0332  WBC 22.7* 15.5* 19.8* 16.9*  HGB 8.9* 8.7* 8.9* 8.6*  HCT 26.8* 25.6* 26.9* 26.5*  PLT 164 185 305 364    COAGS: Recent Labs    12/21/19 1420  INR 1.3*  APTT 45*    BMP: Recent Labs    12/25/19 0306 12/26/19 0332 12/27/19 0341  12/28/19 0332  NA 132* 133* 135 136  K 4.1 4.3 4.1 4.0  CL 104 103 105 106  CO2 17* 18* 18* 18*  GLUCOSE 119* 117* 109* 99  BUN 68* 71* 73* 71*  CALCIUM 7.9* 8.5* 8.5* 8.1*  CREATININE 6.25* 6.48* 6.72* 5.79*  GFRNONAA 10* 9* 9* 10*  GFRAA 11* 11* 10* 12*    LIVER FUNCTION TESTS: Recent Labs    12/24/19 0329 12/25/19 0306 12/26/19 0332 12/27/19 0341  BILITOT 1.6* 1.1 1.5* 1.1  AST 105* 62* 61* 52*  ALT 128* 89* 81* 73*  ALKPHOS 80 64 71 65  PROT 6.8 6.3* 7.2 6.9  ALBUMIN 1.8* 2.1* 2.7* 2.5*    Assessment and Plan:  Intrahepatic abscess; s/p drain placement 12/23/19: 105 cc output documented in Epic, another 25 cc in gravity bag. He is afebrile with WBC trending down. He is ambulating in the room, eating and drinking, he denies pain.   Continue flushing drain and documenting output each shift. Change dressing daily or as needed. May consider additional imaging when output is less than 10 cc/day.   Other plans per primary teams. IR will continue to follow.   Electronically Signed: 12/25/19, AGACNP-BC [phone] 12/28/2019, 6:03 PM   I spent  a total of 15 Minutes at the the patient's bedside AND on the patient's hospital floor or unit, greater than 50% of which was counseling/coordinating care for intrahepatic abscess drain.

## 2019-12-28 NOTE — Evaluation (Signed)
Physical Therapy One Time Evaluation Patient Details Name: Dillon Wolfe MRN: [id number] DOB: [birth year] Today's Date: 12/28/2019   History of Present Illness  49 y.o. male with no past medical history admitted for liver abscess complicated by AKI.  RUQ intra hepatic abscess s/p drain placement on 9.17.21  Clinical Impression  Patient evaluated by Physical Therapy with no further acute PT needs identified. All education has been completed and the patient has no further questions.  See below for any follow-up Physical Therapy or equipment needs. PT is signing off. Thank you for this referral.  Pt not requiring any assist and ambulating in room without any difficulty.  Pt denies any pain.  Pt reports no needs at home.  Pt encouraged to mobilize as tolerated during acute stay (likely will mobilize around room only as he declined hallway today - appears wary of germs).     Follow Up Recommendations No PT follow up    Equipment Recommendations  None recommended by PT    Recommendations for Other Services       Precautions / Restrictions Precautions Precautions: Other (comment) Precaution Comments: R UQ drain      Mobility  Bed Mobility Overal bed mobility: Independent                Transfers Overall transfer level: Independent                  Ambulation/Gait Ambulation/Gait assistance: Independent Gait Distance (Feet): 60 Feet Assistive device: IV Pole Gait Pattern/deviations: WFL(Within Functional Limits)     General Gait Details: no pain, steady, only wanted to ambulate in room  Stairs            Wheelchair Mobility    Modified Rankin (Stroke Patients Only)       Balance Overall balance assessment: No apparent balance deficits (not formally assessed)                                           Pertinent Vitals/Pain Pain Assessment: No/denies pain    Home Living Family/patient expects to be discharged to:: Private  residence Living Arrangements: Alone           Home Layout: One level Home Equipment: None      Prior Function Level of Independence: Independent               Hand Dominance        Extremity/Trunk Assessment   Upper Extremity Assessment Upper Extremity Assessment: Overall WFL for tasks assessed    Lower Extremity Assessment Lower Extremity Assessment: Overall WFL for tasks assessed    Cervical / Trunk Assessment Cervical / Trunk Assessment: Normal  Communication   Communication: No difficulties  Cognition Arousal/Alertness: Awake/alert Behavior During Therapy: WFL for tasks assessed/performed Overall Cognitive Status: Within Functional Limits for tasks assessed                                        General Comments      Exercises     Assessment/Plan    PT Assessment Patent does not need any further PT services  PT Problem List         PT Treatment Interventions      PT Goals (Current goals can be found in the Care Plan section)  Acute Rehab PT Goals PT Goal Formulation: All assessment and education complete, DC therapy    Frequency     Barriers to discharge        Co-evaluation               AM-PAC PT "6 Clicks" Mobility  Outcome Measure Help needed turning from your back to your side while in a flat bed without using bedrails?: None Help needed moving from lying on your back to sitting on the side of a flat bed without using bedrails?: None Help needed moving to and from a bed to a chair (including a wheelchair)?: None Help needed standing up from a chair using your arms (e.g., wheelchair or bedside chair)?: None Help needed to walk in hospital room?: None Help needed climbing 3-5 steps with a railing? : None 6 Click Score: 24    End of Session   Activity Tolerance: Patient tolerated treatment well Patient left: Other (comment) (up in room with diabetes coordinator)   PT Visit Diagnosis: Difficulty in  walking, not elsewhere classified (R26.2)    Time: 0160-1093 PT Time Calculation (min) (ACUTE ONLY): 8 min   Charges:   PT Evaluation $PT Eval Low Complexity: 1 Low         Kati PT, DPT Acute Rehabilitation Services Pager: [phone] Office: [phone]  Sarajane Jews 12/28/2019, 3:34 PM

## 2019-12-28 NOTE — Progress Notes (Addendum)
Subjective:  Patient laying on the bench.  No new complaints.  Is wanting his medical records.  We discussed that these can be obtained through medical records, but that if he has mychart, he has access his medical records in real time.  He is otherwise eating well and voiding well with no new complaints.  We also discussed his labs levels today.  ROS: See above, otherwise other systems negative  Objective: Vital signs in last 24 hours: Temp:  [97.9 F (36.6 C)-98.6 F (37 C)] 97.9 F (36.6 C) (09/22 0615) Pulse Rate:  [80-82] 82 (09/22 0615) Resp:  [18] 18 (09/22 0615) BP: (124-126)/(77-87) 124/77 (09/22 0615) SpO2:  [99 %] 99 % (09/22 0615) Last BM Date: 12/27/19  Intake/Output from previous day: 09/21 0701 - 09/22 0700 In: 2798.3 [P.O.:660; I.V.:1791.6; IV Piggyback:341.7] Out: 855 [Urine:750; Drains:105] Intake/Output this shift: No intake/output data recorded.  PE: Abd: soft, NT, drain in place with bilious appearing output, 105cc documented yesterday total.  +BS  Lab Results:  Recent Labs    12/27/19 0341 12/28/19 0332  WBC 19.8* 16.9*  HGB 8.9* 8.6*  HCT 26.9* 26.5*  PLT 305 364   BMET Recent Labs    12/27/19 0341 12/28/19 0332  NA 135 136  K 4.1 4.0  CL 105 106  CO2 18* 18*  GLUCOSE 109* 99  BUN 73* 71*  CREATININE 6.72* 5.79*  CALCIUM 8.5* 8.1*   PT/INR No results for input(s): LABPROT, INR in the last 72 hours. CMP     Component Value Date/Time   NA 136 12/28/2019 0332   K 4.0 12/28/2019 0332   CL 106 12/28/2019 0332   CO2 18 (L) 12/28/2019 0332   GLUCOSE 99 12/28/2019 0332   BUN 71 (H) 12/28/2019 0332   CREATININE 5.79 (H) 12/28/2019 0332   CALCIUM 8.1 (L) 12/28/2019 0332   PROT 6.9 12/27/2019 0341   ALBUMIN 2.5 (L) 12/27/2019 0341   AST 52 (H) 12/27/2019 0341   ALT 73 (H) 12/27/2019 0341   ALKPHOS 65 12/27/2019 0341   BILITOT 1.1 12/27/2019 0341   GFRNONAA 10 (L) 12/28/2019 0332   GFRAA 12 (L) 12/28/2019 0332   Lipase  No  results found for: LIPASE     Studies/Results: No results found.  Anti-infectives: Anti-infectives (From admission, onward)   Start     Dose/Rate Route Frequency Ordered Stop   12/26/19 2000  cefTRIAXone (ROCEPHIN) 2 g in sodium chloride 0.9 % 100 mL IVPB        2 g 200 mL/hr over 30 Minutes Intravenous Every 24 hours 12/26/19 1538     12/26/19 2000  metroNIDAZOLE (FLAGYL) tablet 500 mg        500 mg Oral Every 8 hours 12/26/19 1538     12/24/19 0600  piperacillin-tazobactam (ZOSYN) IVPB 2.25 g  Status:  Discontinued        2.25 g 100 mL/hr over 30 Minutes Intravenous Every 8 hours 12/24/19 0522 12/24/19 0524   12/24/19 0600  piperacillin-tazobactam (ZOSYN) IVPB 2.25 g  Status:  Discontinued        2.25 g 100 mL/hr over 30 Minutes Intravenous Every 6 hours 12/24/19 0524 12/26/19 1538   12/21/19 2200  piperacillin-tazobactam (ZOSYN) IVPB 3.375 g  Status:  Discontinued        3.375 g 12.5 mL/hr over 240 Minutes Intravenous Every 8 hours 12/21/19 2110 12/24/19 0522   12/21/19 1430  piperacillin-tazobactam (ZOSYN) IVPB 3.375 g  3.375 g 100 mL/hr over 30 Minutes Intravenous  Once 12/21/19 1418 12/21/19 1527       Assessment/Plan AKI -appreciate nephrology following, Cr improved to 5.7 today.  ? When patient is ready for DC from nephrology standpoint  Likely type 2 DM - A1c 7.6, will need follow up with a PCP for this, [place] and wellness appt set up.  Good control currently on CM diet.  No treatment right now warranted.  Bacteroides fragilis bacteremia RUQ abscess   - afebrile, VSS, WBC down to 16K today  - DDx: perforated gallbladder w/ intraperitoneal gallstone vs perforated appendicitis   - S/P IR perc drain 12/23/19, continue drain; cultures w/ E.coli, strep constellatus resistant to ampicillin   - appreciate ID consult, IV Rocephin and Flagyl.  Will go home with a stop date of 10/6  -Plan to rescan patient right at discharge when cleared by renal for DC home  for baseline scan of drain prior to DC.  FEN: Reg diet  ID: Zosyn 9/15 >>9/20, Rocephin/Flagyl 9/20 --> VTE: Lovenox  Foley: None   LOS: 7 days   Letha Cape , [facility] 12/28/2019, 10:24 AM Please see Amion for pager number during day hours 7:00am-4:30pm or 7:00am -11:30am on weekends  Agree with above. Discussed with Dr. Arta Silence.  If creatinine continues to come down, possibly okay to discharge tomorrow from renal standpoint.  We will probably get another CT scan to document the abscess size prior to discharge.  Ovidio Kin, MD, [facility] Office phone:  [phone]

## 2019-12-28 NOTE — Progress Notes (Signed)
Nutrition Follow-up  DOCUMENTATION CODES:   Obesity unspecified  INTERVENTION:   -Ensure MAX Protein po daily, each supplement provides 150 kcal and 30 grams of protein -Carbohydrate Counting handout in discharge instructions  NUTRITION DIAGNOSIS:   Increased nutrient needs related to acute illness (liver abscess) as evidenced by estimated needs.  Ongoing.  GOAL:   Patient will meet greater than or equal to 90% of their needs  Progressing.  MONITOR:   PO intake, Supplement acceptance, Labs, Weight trends, I & O's  REASON FOR ASSESSMENT:   Consult Diet education  ASSESSMENT:   49 y.o. male with a h/o fall 2 weeks ago and now a large posterior liver abscess.  9/17: s/p CT guided right abdominal fluid collection aspiration and drainage  Patient reports not eating any breakfast this morning but did drink his Ensure Max. Pt states he has been reading the Living Well book and currently had no questions for RD regarding diet. Pt had been drinking sugary beverages but knows now to switch to unsweetened beverages. Will place CHO counting handout in discharge instructions as another resource.  Admission weight: 230 lbs. Current weight: 225 lbs  Medications: Colace Labs reviewed: Glucose 99-119  Diet Order:   Diet Order            Diet Carb Modified Fluid consistency: Thin; Room service appropriate? Yes  Diet effective now                 EDUCATION NEEDS:   No education needs have been identified at this time  Skin:  Skin Assessment: Reviewed RN Assessment  Last BM:  9/21  Height:   Ht Readings from Last 1 Encounters:  12/22/19 5\' 8"  (1.727 m)    Weight:   Wt Readings from Last 1 Encounters:  12/26/19 102.5 kg   BMI:  Body mass index is 34.36 kg/m.  Estimated Nutritional Needs:   Kcal:  1900-2100  Protein:  80-90g  Fluid:  2L/day  12/28/19, MS, RD, LDN Inpatient Clinical Dietitian Contact information available via Amion

## 2019-12-28 NOTE — Progress Notes (Signed)
Nephrology Follow-Up Consult note   Summary: Dillon Wolfe is a/an 49 y.o. male with no past medical history admitted for liver abscess complicated by AKI  Assess/Plan: Non-Oliguric AKI: admit creat was 1.6, no old creatinine. Pt w/ ATN due to contrast+ dehydration + hypotension/ sepsis. Creat peaked at 6.7 on 9/21 and down today to 5.79. Not collecting his urine today but yest had 8 L recorded. RUS showed normal kidneys, no obstruction. Wt's are down so IVF"s were resumed yesterday. No uremic symptoms. Suspect he is recovering and if creat continues to improve anticipate ok for dc in 1-2 days.   Volume Status: as above, stable  Hypotension: resolved, 126/80 now  Liver abscess/ bacteroides sepsis: wound cx growing several different bacteria. Currently on IV Rocephin and po flagyl, to get 21 days total w/ home IV abx and po flagyl.   Leukocytosis improving.  Infectious disease and general surgery managing.  Anemia: Hemoglobin steady at 8.7 today.  Continue to monitor.  Kidney disease likely contributing minimally.  Hyperglycemia/Diabetes: Likely type 2 diabetes with hemoglobin A1c of 7.1. Seen by IM team, see their remarks, no need diab meds for now, f/u w/ PCP.  New PCP appt was made by primary team and CM.   Hypoalbuminemia: Improved to 2.5       Maree Krabbe [facility] 12/28/2019 2:34 PM  ___________________________________________________________  CC: AKI  Interval History/Subjective: feeling okay, no c/o's, no n/v or abd pain. Started tracking UOP and 7 L recorded today?? This may be a mistake. Wt's are down though. Creat up slightly.    Medications:  Current Facility-Administered Medications  Medication Dose Route Frequency Provider Last Rate Last Admin  . 0.9 %  sodium chloride infusion   Intravenous Continuous Delano Metz, MD 125 mL/hr at 12/28/19 1219 New Bag at 12/28/19 1219  . acetaminophen (TYLENOL) tablet 1,000 mg  1,000 mg Oral  Q6H PRN Romie Levee, MD      . cefTRIAXone (ROCEPHIN) 2 g in sodium chloride 0.9 % 100 mL IVPB  2 g Intravenous Q24H Ginnie Smart, MD 200 mL/hr at 12/27/19 2022 2 g at 12/27/19 2022  . Chlorhexidine Gluconate Cloth 2 % PADS 6 each  6 each Topical Daily Drema Dallas, MD   6 each at 12/28/19 1006  . diphenhydrAMINE (BENADRYL) capsule 25 mg  25 mg Oral Q6H PRN Maczis, Elmer Sow, PA-C       Or  . diphenhydrAMINE (BENADRYL) injection 25 mg  25 mg Intravenous Q6H PRN Maczis, Elmer Sow, PA-C      . docusate sodium (COLACE) capsule 100 mg  100 mg Oral BID Romie Levee, MD   100 mg at 12/28/19 1007  . HYDROcodone-acetaminophen (NORCO/VICODIN) 5-325 MG per tablet 1-2 tablet  1-2 tablet Oral Q6H PRN Romie Levee, MD      . melatonin tablet 3 mg  3 mg Oral QHS Jacinto Halim, PA-C   3 mg at 12/27/19 2203  . metroNIDAZOLE (FLAGYL) tablet 500 mg  500 mg Oral Q8H Ginnie Smart, MD   500 mg at 12/28/19 1307  . morphine 2 MG/ML injection 2 mg  2 mg Intravenous Q2H PRN Romie Levee, MD      . ondansetron (ZOFRAN-ODT) disintegrating tablet 4 mg  4 mg Oral Q6H PRN Romie Levee, MD       Or  . ondansetron [facility]) injection 4 mg  4 mg Intravenous Q6H PRN Romie Levee, MD      . protein supplement (ENSURE MAX) liquid  11 oz Oral Daily Andria Meuse, MD   11 oz at 12/28/19 1010  . simethicone (MYLICON) chewable tablet 40 mg  40 mg Oral Q6H PRN Romie Levee, MD      . sodium bicarbonate tablet 1,300 mg  1,300 mg Oral TID Darnell Level, MD   1,300 mg at 12/28/19 1216  . sodium chloride flush (NS) 0.9 % injection 10-40 mL  10-40 mL Intracatheter PRN Drema Dallas, MD   10 mL at 12/27/19 0342  . sodium chloride flush (NS) 0.9 % injection 5 mL  5 mL Intracatheter Q8H Suttle, Thressa Sheller, MD   5 mL at 12/28/19 1307      Review of Systems: 10 systems reviewed and negative except per interval history/subjective  Physical Exam: Vitals:   12/28/19 0615 12/28/19 1337  BP: 124/77  123/66  Pulse: 82 81  Resp: 18   Temp: 97.9 F (36.6 C) 98.8 F (37.1 C)  SpO2: 99% 100%   Total I/O In: 740.2 [P.O.:240; I.V.:500.2] Out: 30 [Drains:30]  Intake/Output Summary (Last 24 hours) at 12/28/2019 1434 Last data filed at 12/28/2019 1355 Gross per 24 hour  Intake 3538.49 ml  Output 870 ml  Net 2668.49 ml   Constitutional: well-appearing, no acute distress ENMT: ears and nose without scars or lesions, MMM CV: normal rate, no edema Respiratory: Bilateral chest rise, normal work of breathing Gastrointestinal: soft, bowel sounds present Skin: no visible lesions or rashes Psych: alert, judgement/insight appropriate, appropriate mood and affect   Test Results I personally reviewed new and old clinical labs and radiology tests Lab Results  Component Value Date   NA 136 12/28/2019   K 4.0 12/28/2019   CL 106 12/28/2019   CO2 18 (L) 12/28/2019   BUN 71 (H) 12/28/2019   CREATININE 5.79 (H) 12/28/2019   CALCIUM 8.1 (L) 12/28/2019   ALBUMIN 2.5 (L) 12/27/2019

## 2019-12-29 ENCOUNTER — Inpatient Hospital Stay (HOSPITAL_COMMUNITY): Payer: Medicaid Other

## 2019-12-29 LAB — CBC
HCT: 26.6 % — ABNORMAL LOW (ref 39.0–52.0)
Hemoglobin: 8.5 g/dL — ABNORMAL LOW (ref 13.0–17.0)
MCH: 25.4 pg — ABNORMAL LOW (ref 26.0–34.0)
MCHC: 32 g/dL (ref 30.0–36.0)
MCV: 79.4 fL — ABNORMAL LOW (ref 80.0–100.0)
Platelets: 467 K/uL — ABNORMAL HIGH (ref 150–400)
RBC: 3.35 MIL/uL — ABNORMAL LOW (ref 4.22–5.81)
RDW: 15.9 % — ABNORMAL HIGH (ref 11.5–15.5)
WBC: 18.5 K/uL — ABNORMAL HIGH (ref 4.0–10.5)
nRBC: 0 % (ref 0.0–0.2)

## 2019-12-29 LAB — BASIC METABOLIC PANEL
Anion gap: 15 (ref 5–15)
BUN: 59 mg/dL — ABNORMAL HIGH (ref 6–20)
CO2: 15 mmol/L — ABNORMAL LOW (ref 22–32)
Calcium: 8.3 mg/dL — ABNORMAL LOW (ref 8.9–10.3)
Chloride: 107 mmol/L (ref 98–111)
Creatinine, Ser: 5.02 mg/dL — ABNORMAL HIGH (ref 0.61–1.24)
GFR calc Af Amer: 14 mL/min — ABNORMAL LOW (ref >60)
GFR calc non Af Amer: 12 mL/min — ABNORMAL LOW (ref >60)
Glucose, Bld: 85 mg/dL (ref 70–99)
Potassium: 3.8 mmol/L (ref 3.5–5.1)
Sodium: 137 mmol/L (ref 135–145)

## 2019-12-29 MED ORDER — BLOOD GLUCOSE METER KIT: PACK | 0 refills | Status: DC

## 2019-12-29 MED ORDER — IOHEXOL 9 MG/ML PO SOLN: 500.0000 mL | ORAL | Status: AC

## 2019-12-29 NOTE — Progress Notes (Addendum)
Subjective:  Patient remains frustrated with situation overall.  Denies abdominal pain.  Disappointed he has to go home with his drain.  ROS: See above, otherwise other systems negative  Objective: Vital signs in last 24 hours: Temp:  [98.4 F (36.9 C)-98.8 F (37.1 C)] 98.8 F (37.1 C) (09/23 0507) Pulse Rate:  [81-87] 83 (09/23 0507) Resp:  [18] 18 (09/23 0507) BP: (123-149)/(66-96) 136/91 (09/23 0507) SpO2:  [98 %-100 %] 98 % (09/23 0507) Last BM Date: 12/27/19  Intake/Output from previous day: 09/22 0701 - 09/23 0700 In: 2370.7 [P.O.:1080; I.V.:1290.7] Out: 2105 [Urine:2000; Drains:105] Intake/Output this shift: No intake/output data recorded.  PE: Abd: soft, NT, drain in place with some bilious/yellow looking output.  About 30cc in bag currently.  105cc noted yesterday  Lab Results:  Recent Labs    12/28/19 0332 12/29/19 0305  WBC 16.9* 18.5*  HGB 8.6* 8.5*  HCT 26.5* 26.6*  PLT 364 467*   BMET Recent Labs    12/28/19 0332 12/29/19 0305  NA 136 137  K 4.0 3.8  CL 106 107  CO2 18* 15*  GLUCOSE 99 85  BUN 71* 59*  CREATININE 5.79* 5.02*  CALCIUM 8.1* 8.3*   PT/INR No results for input(s): LABPROT, INR in the last 72 hours. CMP     Component Value Date/Time   NA 137 12/29/2019 0305   K 3.8 12/29/2019 0305   CL 107 12/29/2019 0305   CO2 15 (L) 12/29/2019 0305   GLUCOSE 85 12/29/2019 0305   BUN 59 (H) 12/29/2019 0305   CREATININE 5.02 (H) 12/29/2019 0305   CALCIUM 8.3 (L) 12/29/2019 0305   PROT 6.9 12/27/2019 0341   ALBUMIN 2.5 (L) 12/27/2019 0341   AST 52 (H) 12/27/2019 0341   ALT 73 (H) 12/27/2019 0341   ALKPHOS 65 12/27/2019 0341   BILITOT 1.1 12/27/2019 0341   GFRNONAA 12 (L) 12/29/2019 0305   GFRAA 14 (L) 12/29/2019 0305   Lipase  No results found for: LIPASE     Studies/Results: No results found.  Anti-infectives: Anti-infectives (From admission, onward)   Start     Dose/Rate Route Frequency Ordered Stop   12/26/19  2000  cefTRIAXone (ROCEPHIN) 2 g in sodium chloride 0.9 % 100 mL IVPB        2 g 200 mL/hr over 30 Minutes Intravenous Every 24 hours 12/26/19 1538     12/26/19 2000  metroNIDAZOLE (FLAGYL) tablet 500 mg        500 mg Oral Every 8 hours 12/26/19 1538     12/24/19 0600  piperacillin-tazobactam (ZOSYN) IVPB 2.25 g  Status:  Discontinued        2.25 g 100 mL/hr over 30 Minutes Intravenous Every 8 hours 12/24/19 0522 12/24/19 0524   12/24/19 0600  piperacillin-tazobactam (ZOSYN) IVPB 2.25 g  Status:  Discontinued        2.25 g 100 mL/hr over 30 Minutes Intravenous Every 6 hours 12/24/19 0524 12/26/19 1538   12/21/19 2200  piperacillin-tazobactam (ZOSYN) IVPB 3.375 g  Status:  Discontinued        3.375 g 12.5 mL/hr over 240 Minutes Intravenous Every 8 hours 12/21/19 2110 12/24/19 0522   12/21/19 1430  piperacillin-tazobactam (ZOSYN) IVPB 3.375 g        3.375 g 100 mL/hr over 30 Minutes Intravenous  Once 12/21/19 1418 12/21/19 1527       Assessment/Plan AKI -appreciate nephrology following,Cr improved to 5.7 today.  ? When patient is ready for  DC from nephrology standpoint  Likely type 2 DM - A1c 7.6, will need follow up with a PCP for this, [place] and wellness appt set up.  Good control currently on CM diet.  No treatment right now warranted.  Bacteroides fragilis bacteremia RUQ abscess              - afebrile, VSS, [id number]             - DDx: perforated gallbladder w/ intraperitoneal gallstone vs perforated appendicitis              - S/P IR perc drain 12/23/19, continue drain; cultures w/ E.coli, strep constellatus resistant to ampicillin              - appreciate ID consult,IV Rocephin and Flagyl. Will go home with a stop date of 10/6             -Plan to rescan today prior to discharge soon given persistently elevated WBC to assure stability prior to discharge.  Obviously, no IV contrast.  Patient is hesistent to drink oral contrast.  I explained this has no effect on his  kidneys but that ultimately this was his choice.  We discussed that this is helpful to [place] in evaluating intestinal issues on scans.  -we discussed the need to go home with his drain and how to address that in return to work.    -we discussed likely discharge in the next 1-2 days  FEN: Reg diet  ID: Zosyn 9/15 >>9/20, Rocephin/Flagyl 9/20 --> VTE: Lovenox  Foley: None   LOS: 8 days    Letha Cape , [facility] 12/29/2019, 10:36 AM Please see Amion for pager number during day hours 7:00am-4:30pm or 7:00am -11:30am on weekends  Agree with above. Looking to discharge tomorrow.  Ovidio Kin, MD, [facility] Surgery Office phone:  [phone]

## 2019-12-29 NOTE — Plan of Care (Signed)

## 2019-12-29 NOTE — Discharge Instructions (Addendum)
Carbohydrate Counting For People With Diabetes  Foods with carbohydrates make your blood glucose level go up. Learning how to count carbohydrates can help you control your blood glucose levels. First, identify the foods you eat that contain carbohydrates. Then, using the Foods with Carbohydrates chart, determine about how much carbohydrates are in your meals and snacks. Make sure you are eating foods with fiber, protein, and healthy fat along with your carbohydrate foods. Foods with Carbohydrates The following table shows carbohydrate foods that have about 15 grams of carbohydrate each. Using measuring cups, spoons, or a food scale when you first begin learning about carbohydrate counting can help you learn about the portion sizes you typically eat. The following foods have 15 grams carbohydrate each:  Grains . 1 slice bread (1 ounce)  . 1 small tortilla (6-inch size)  .  large bagel (1 ounce)  . 1/3 cup pasta or rice (cooked)  .  hamburger or hot dog bun ( ounce)  .  cup cooked cereal  .  to  cup ready-to-eat cereal  . 2 taco shells (5-inch size) Fruit . 1 small fresh fruit ( to 1 cup)  .  medium banana  . 17 small grapes (3 ounces)  . 1 cup melon or berries  .  cup canned or frozen fruit  . 2 tablespoons dried fruit (blueberries, cherries, cranberries, raisins)  .  cup unsweetened fruit juice  Starchy Vegetables .  cup cooked beans, peas, corn, potatoes/sweet potatoes  .  large baked potato (3 ounces)  . 1 cup acorn or butternut squash  Snack Foods . 3 to 6 crackers  . 8 potato chips or 13 tortilla chips ( ounce to 1 ounce)  . 3 cups popped popcorn  Dairy . 3/4 cup (6 ounces) nonfat plain yogurt, or yogurt with sugar-free sweetener  . 1 cup milk  . 1 cup plain rice, soy, coconut or flavored almond milk Sweets and Desserts .  cup ice cream or frozen yogurt  . 1 tablespoon jam, jelly, pancake syrup, table sugar, or honey  . 2 tablespoons light pancake syrup  . 1 inch  square of frosted cake or 2 inch square of unfrosted cake  . 2 small cookies (2/3 ounce each) or  large cookie  Sometimes you'll have to estimate carbohydrate amounts if you don't know the exact recipe. One cup of mixed foods like soups can have 1 to 2 carbohydrate servings, while some casseroles might have 2 or more servings of carbohydrate. Foods that have less than 20 calories in each serving can be counted as "free" foods. Count 1 cup raw vegetables, or  cup cooked non-starchy vegetables as "free" foods. If you eat 3 or more servings at one meal, then count them as 1 carbohydrate serving.  Foods without Carbohydrates  Not all foods contain carbohydrates. Meat, some dairy, fats, non-starchy vegetables, and many beverages don't contain carbohydrate. So when you count carbohydrates, you can generally exclude chicken, pork, beef, fish, seafood, eggs, tofu, cheese, butter, sour cream, avocado, nuts, seeds, olives, mayonnaise, water, black coffee, unsweetened tea, and zero-calorie drinks. Vegetables with no or low carbohydrate include green beans, cauliflower, tomatoes, and onions. How much carbohydrate should I eat at each meal?  Carbohydrate counting can help you plan your meals and manage your weight. Following are some starting points for carbohydrate intake at each meal. Work with your registered dietitian nutritionist to find the best range that works for your blood glucose and weight.   To Lose Weight To  Maintain Weight  Women 2 - 3 carb servings 3 - 4 carb servings  Men 3 - 4 carb servings 4 - 5 carb servings  Checking your blood glucose after meals will help you know if you need to adjust the timing, type, or number of carbohydrate servings in your meal plan. Achieve and keep a healthy body weight by balancing your food intake and physical activity.  Tips How should I plan my meals?  Plan for half the food on your plate to include non-starchy vegetables, like salad greens, broccoli, or  carrots. Try to eat 3 to 5 servings of non-starchy vegetables every day. Have a protein food at each meal. Protein foods include chicken, fish, meat, eggs, or beans (note that beans contain carbohydrate). These two food groups (non-starchy vegetables and proteins) are low in carbohydrate. If you fill up your plate with these foods, you will eat less carbohydrate but still fill up your stomach. Try to limit your carbohydrate portion to  of the plate.  What fats are healthiest to eat?  Diabetes increases risk for heart disease. To help protect your heart, eat more healthy fats, such as olive oil, nuts, and avocado. Eat less saturated fats like butter, cream, and high-fat meats, like bacon and sausage. Avoid trans fats, which are in all foods that list "partially hydrogenated oil" as an ingredient. What should I drink?  Choose drinks that are not sweetened with sugar. The healthiest choices are water, carbonated or seltzer waters, and tea and coffee without added sugars.  Sweet drinks will make your blood glucose go up very quickly. One serving of soda or energy drink is  cup. It is best to drink these beverages only if your blood glucose is low.  Artificially sweetened, or diet drinks, typically do not increase your blood glucose if they have zero calories in them. Read labels of beverages, as some diet drinks do have carbohydrate and will raise your blood glucose. Label Reading Tips Read Nutrition Facts labels to find out how many grams of carbohydrate are in a food you want to eat. Don't forget: sometimes serving sizes on the label aren't the same as how much food you are going to eat, so you may need to calculate how much carbohydrate is in the food you are serving yourself.   Carbohydrate Counting for People with Diabetes Sample 1-Day Menu  Breakfast  cup yogurt, low fat, low sugar (1 carbohydrate serving)   cup cereal, ready-to-eat, unsweetened (1 carbohydrate serving)  1 cup strawberries (1  carbohydrate serving)   cup almonds ( carbohydrate serving)  Lunch 1, 5 ounce can chunk light tuna  2 ounces cheese, low fat cheddar  6 whole wheat crackers (1 carbohydrate serving)  1 small apple (1 carbohydrate servings)   cup carrots ( carbohydrate serving)   cup snap peas  1 cup 1% milk (1 carbohydrate serving)   Evening Meal Stir fry made with: 3 ounces chicken  1 cup brown rice (3 carbohydrate servings)   cup broccoli ( carbohydrate serving)   cup green beans   cup onions  1 tablespoon olive oil  2 tablespoons teriyaki sauce ( carbohydrate serving)  Evening Snack 1 extra small banana (1 carbohydrate serving)  1 tablespoon peanut butter   Carbohydrate Counting for People with Diabetes Vegan Sample 1-Day Menu  Breakfast 1 cup cooked oatmeal (2 carbohydrate servings)   cup blueberries (1 carbohydrate serving)  2 tablespoons flaxseeds  1 cup soymilk fortified with calcium and vitamin D  1  cup coffee  Lunch 2 slices whole wheat bread (2 carbohydrate servings)   cup baked tofu   cup lettuce  2 slices tomato  2 slices avocado   cup baby carrots ( carbohydrate serving)  1 orange (1 carbohydrate serving)  1 cup soymilk fortified with calcium and vitamin D   Evening Meal Burrito made with: 1 6-inch corn tortilla (1 carbohydrate serving)  1 cup refried vegetarian beans (2 carbohydrate servings)   cup chopped tomatoes   cup lettuce   cup salsa  1/3 cup brown rice (1 carbohydrate serving)  1 tablespoon olive oil for rice   cup zucchini   Evening Snack 6 small whole grain crackers (1 carbohydrate serving)  2 apricots ( carbohydrate serving)   cup unsalted peanuts ( carbohydrate serving)    Carbohydrate Counting for People with Diabetes Vegetarian (Lacto-Ovo) Sample 1-Day Menu  Breakfast 1 cup cooked oatmeal (2 carbohydrate servings)   cup blueberries (1 carbohydrate serving)  2 tablespoons flaxseeds  1 egg  1 cup 1% milk (1 carbohydrate serving)  1  cup coffee  Lunch 2 slices whole wheat bread (2 carbohydrate servings)  2 ounces low-fat cheese   cup lettuce  2 slices tomato  2 slices avocado   cup baby carrots ( carbohydrate serving)  1 orange (1 carbohydrate serving)  1 cup unsweetened tea  Evening Meal Burrito made with: 1 6-inch corn tortilla (1 carbohydrate serving)   cup refried vegetarian beans (1 carbohydrate serving)   cup tomatoes   cup lettuce   cup salsa  1/3 cup brown rice (1 carbohydrate serving)  1 tablespoon olive oil for rice   cup zucchini  1 cup 1% milk (1 carbohydrate serving)  Evening Snack 6 small whole grain crackers (1 carbohydrate serving)  2 apricots ( carbohydrate serving)   cup unsalted peanuts ( carbohydrate serving)    Copyright 2020  Academy of Nutrition and Dietetics. All rights reserved.  Using Nutrition Labels: Carbohydrate  . Serving Size  . Look at the serving size. All the information on the label is based on this portion. Jolyne Loa Per Container  . The number of servings contained in the package. . Guidelines for Carbohydrate  . Look at the total grams of carbohydrate in the serving size.  . 1 carbohydrate choice = 15 grams of carbohydrate. Range of Carbohydrate Grams Per Choice  Carbohydrate Grams/Choice Carbohydrate Choices  6-10   11-20 1  21-25 1  26-35 2  36-40 2  41-50 3  51-55 3  56-65 4  66-70 4  71-80 5    Copyright 2020  Academy of Nutrition and Dietetics. All rights reserved.      Percutaneous Abscess Drain, Care After This sheet gives you information about how to care for yourself after your procedure. Your health care provider may also give you more specific instructions. If you have problems or questions, contact your health care provider. What can I expect after the procedure? After your procedure, it is common to have:  A small amount of bruising and discomfort in the area where the drainage tube (catheter) was placed. Follow these  instructions at home: Incision care  Follow instructions from your health care provider about how to take care of your incision. Make sure you: ? Wash your hands with soap and water before you change your bandage (dressing). If soap and water are not available, use hand sanitizer. ? Change your dressing as told by your health care provider- change when saturated, ensure area remains  clean and dry. ? Leave stitches (sutures), skin glue, or adhesive strips in place.  Check your incision area every day for signs of infection. Check for: ? More redness, swelling, or pain. ? More fluid or blood. ? Warmth. ? Pus or a bad smell. ? Fluid leaking from around your catheter (instead of fluid draining through your catheter). Catheter care  Follow instructions from your health care provider about emptying and cleaning your catheter and collection bag.  Flush catheter once daily with 5-10 cc NS.  Write down the following information every time you empty your bag (at least once daily): ? The date and time. ? The amount of drainage. General instructions  Do not shower, take baths, or swim with drain in place. Ok to sponge bath around drain.  Keep all follow-up visits as told by your health care provider. This is important. 2 week clinic follow-up. We will call you to schedule this appointment. Contact a health care provider if:  You have less than 10 mL of drainage a day for 2-3 days in a row, or as directed by your health care provider.  You have more redness, swelling, or pain around your incision area.  You have more fluid or blood coming from your incision area.  Your incision area feels warm to the touch.  You have pus or a bad smell coming from your incision area.  You have fluid leaking from around your catheter (instead of through your catheter).  You have a fever or chills.  You have pain that does not get better with medicine. Get help right away if:  Your catheter comes  out.  You suddenly stop having drainage from your catheter.  You suddenly have blood in the fluid that is draining from your catheter.  You become dizzy or you faint.  You develop a rash.  You have nausea or vomiting.  You have difficulty breathing or you feel short of breath.  You develop chest pain.  You have problems with your speech or vision.  You have trouble balancing or moving your arms or legs.  This information is not intended to replace advice given to you by your health care provider. Make sure you discuss any questions you have with your health care provider. Document Revised: 03/06/2017 Document Reviewed: 02/14/2016 Elsevier Patient Education  2020 Elsevier Inc.   Acute Kidney Injury, Adult  Acute kidney injury is a sudden worsening of kidney function. The kidneys are organs that have several jobs. They filter the blood to remove waste products and extra fluid. They also maintain a healthy balance of minerals and hormones in the body, which helps control blood pressure and keep bones strong. With this condition, your kidneys do not do their jobs as well as they should. This condition ranges from mild to severe. Over time it may develop into long-lasting (chronic) kidney disease. Early detection and treatment may prevent acute kidney injury from developing into a chronic condition. What are the causes? Common causes of this condition include:  A problem with blood flow to the kidneys. This may be caused by: ? Low blood pressure (hypotension) or shock. ? Blood loss. ? Heart and blood vessel (cardiovascular) disease. ? Severe burns. ? Liver disease.  Direct damage to the kidneys. This may be caused by: ? Certain medicines. ? A kidney infection. ? Poisoning. ? Being around or in contact with toxic substances. ? A surgical wound. ? A hard, direct hit to the kidney area.  A sudden blockage of  urine flow. This may be caused by: ? Cancer. ? Kidney stones. ? An  enlarged prostate in males. What are the signs or symptoms? Symptoms of this condition may not be obvious until the condition becomes severe. Symptoms of this condition can include:  Tiredness (lethargy), or difficulty staying awake.  Nausea or vomiting.  Swelling (edema) of the face, legs, ankles, or feet.  Problems with urination, such as: ? Abdominal pain, or pain along the side of your stomach (flank). ? Decreased urine production. ? Decrease in the force of urine flow.  Muscle twitches and cramps, especially in the legs.  Confusion or trouble concentrating.  Loss of appetite.  Fever. How is this diagnosed? This condition may be diagnosed with tests, including:  Blood tests.  Urine tests.  Imaging tests.  A test in which a sample of tissue is removed from the kidneys to be examined under a microscope (kidney biopsy). How is this treated? Treatment for this condition depends on the cause and how severe the condition is. In mild cases, treatment may not be needed. The kidneys may heal on their own. In more severe cases, treatment will involve:  Treating the cause of the kidney injury. This may involve changing any medicines you are taking or adjusting your dosage.  Fluids. You may need specialized IV fluids to balance your body's needs.  Having a catheter placed to drain urine and prevent blockages.  Preventing problems from occurring. This may mean avoiding certain medicines or procedures that can cause further injury to the kidneys. In some cases treatment may also require:  A procedure to remove toxic wastes from the body (dialysis or continuous renal replacement therapy - CRRT).  Surgery. This may be done to repair a torn kidney, or to remove the blockage from the urinary system. Follow these instructions at home: Medicines  Take over-the-counter and prescription medicines only as told by your health care provider.  Do not take any new medicines without your  health care provider's approval. Many medicines can worsen your kidney damage.  Do not take any vitamin and mineral supplements without your health care provider's approval. Many nutritional supplements can worsen your kidney damage. Lifestyle  If your health care provider prescribed changes to your diet, follow them. You may need to decrease the amount of protein you eat.  Achieve and maintain a healthy weight. If you need help with this, ask your health care provider.  Start or continue an exercise plan. Try to exercise at least 30 minutes a day, 5 days a week.  Do not use any tobacco products, such as cigarettes, chewing tobacco, and e-cigarettes. If you need help quitting, ask your health care provider. General instructions  Keep track of your blood pressure. Report changes in your blood pressure as told by your health care provider.  Stay up to date with immunizations. Ask your health care provider which immunizations you need.  Keep all follow-up visits as told by your health care provider. This is important. Where to find more information  American Association of Kidney Patients: ResidentialShow.iswww.aakp.org  [employer]: www.kidney.org  American Kidney Fund: FightingMatch.com.eewww.akfinc.org  [employer] [facility]: ? www.lifeoptions.org ? www.kidneyschool.org Contact a health care provider if:  Your symptoms get worse.  You develop new symptoms. Get help right away if:  You develop symptoms of worsening kidney disease, which include: ? Headaches. ? Abnormally dark or light skin. ? Easy bruising. ? Frequent hiccups. ? Chest pain. ? Shortness of breath. ? End of menstruation  in women. ? Seizures. ? Confusion or altered mental status. ? Abdominal or back pain. ? Itchiness.  You have a fever.  Your body is producing less urine.  You have pain or bleeding when you urinate. Summary  Acute kidney injury is a sudden worsening of kidney function.  Acute kidney  injury can be caused by problems with blood flow to the kidneys, direct damage to the kidneys, and sudden blockage of urine flow.  Symptoms of this condition may not be obvious until it becomes severe. Symptoms may include edema, lethargy, confusion, nausea or vomiting, and problems passing urine.  This condition can usually be diagnosed with blood tests, urine tests, and imaging tests. Sometimes a kidney biopsy is done to diagnose this condition.  Treatment for this condition often involves treating the underlying cause. It is treated with fluids, medicines, dialysis, diet changes, or surgery. This information is not intended to replace advice given to you by your health care provider. Make sure you discuss any questions you have with your health care provider. Document Revised: 03/06/2017 Document Reviewed: 03/14/2016 Elsevier Patient Education  2020 [employer].

## 2019-12-29 NOTE — Progress Notes (Signed)
Referring Physician(s): Sherrie George ([facility])  Supervising Physician: Gilmer Mor  Patient Status:  [facility] - In-pt  Chief Complaint: Unpleased with hospital stay  Subjective:  History of intra-abdominal/intra-hepatic fluid collection concerning for abscess s/p RUQ/liver drain placement in IR 12/23/2019. Patient awake and alert laying in bed. Has multiple complaints this AM- frustrated that he is getting a repeat CT, frustrated he is not at home, frustrated he has to go home with drain. Attempted to reassure patient we are doing all we can to help him. RUQ/liver drain site c/d/i.   Allergies: Patient has no known allergies.  Medications: Prior to Admission medications   Medication Sig Start Date End Date Taking? Authorizing Provider  ibuprofen (ADVIL,MOTRIN) 800 MG tablet Take 1 tablet (800 mg total) by mouth 3 (three) times daily. Patient taking differently: Take 800 mg by mouth in the morning.  09/24/17  Yes Melene Plan, DO  HYDROcodone-acetaminophen (NORCO/VICODIN) 5-325 MG per tablet 1-2 tablets po q 6 hours prn moderate to severe pain Patient not taking: Reported on 12/21/2019 03/01/12   Quita Skye, MD     Vital Signs: BP (!) 136/91 (BP Location: Right Arm)   Pulse 83   Temp 98.8 F (37.1 C) (Oral)   Resp 18   Ht 5\' 8"  (1.727 m)   Wt 225 lb 15.5 oz (102.5 kg)   SpO2 98%   BMI 34.36 kg/m   Physical Exam Vitals and nursing note reviewed.  Constitutional:      General: He is not in acute distress. Pulmonary:     Effort: Pulmonary effort is normal. No respiratory distress.  Abdominal:     Comments: RUQ/liver drain site with mild tenderness, no erythema, drainage, or active bleeding; approximately 25-50 cc purulent drainage in gravity bag.   Skin:    General: Skin is warm and dry.  Neurological:     Mental Status: He is alert and oriented to person, place, and time.     Imaging: No results found.  Labs:  CBC: Recent Labs    12/25/19 0306  12/27/19 0341 12/28/19 0332 12/29/19 0305  WBC 15.5* 19.8* 16.9* 18.5*  HGB 8.7* 8.9* 8.6* 8.5*  HCT 25.6* 26.9* 26.5* 26.6*  PLT 185 305 364 467*    COAGS: Recent Labs    12/21/19 1420  INR 1.3*  APTT 45*    BMP: Recent Labs    12/26/19 0332 12/27/19 0341 12/28/19 0332 12/29/19 0305  NA 133* 135 136 137  K 4.3 4.1 4.0 3.8  CL 103 105 106 107  CO2 18* 18* 18* 15*  GLUCOSE 117* 109* 99 85  BUN 71* 73* 71* 59*  CALCIUM 8.5* 8.5* 8.1* 8.3*  CREATININE 6.48* 6.72* 5.79* 5.02*  GFRNONAA 9* 9* 10* 12*  GFRAA 11* 10* 12* 14*    LIVER FUNCTION TESTS: Recent Labs    12/24/19 0329 12/25/19 0306 12/26/19 0332 12/27/19 0341  BILITOT 1.6* 1.1 1.5* 1.1  AST 105* 62* 61* 52*  ALT 128* 89* 81* 73*  ALKPHOS 80 64 71 65  PROT 6.8 6.3* 7.2 6.9  ALBUMIN 1.8* 2.1* 2.7* 2.5*    Assessment and Plan:  History of intra-abdominal/intra-hepatic fluid collection concerning for abscess s/p RUQ/liver drain placement in IR 12/23/2019. RUQ/liver drain stable with approximately 25-50 cc purulent drainage in gravity bag (additional 105 cc output from drain in past 24 hours). Continue current drain management- continue with Qshift flushes/monitor of output. For follow-up CT today per [facility].  If patient is to be discharged, below  are discharge instructions: - Flush each drain once daily with 5-10 cc NS flush (patient will need an order for flushes upon discharge). RN aware to teach patient how to manage drains at home. - Record output from each drain once daily. - Follow-up at drain clinic 2 weeks after discharge- order placed to facilitate this.  Further plans per [facility]/nephrology- appreciate and agree with management. IR to follow.   Electronically Signed: Elwin Mocha, PA-C 12/29/2019, 10:22 AM   I spent a total of 25 Minutes at the the patient's bedside AND on the patient's hospital floor or unit, greater than 50% of which was counseling/coordinating care for liver abscess s/p  drain placement.

## 2019-12-29 NOTE — Progress Notes (Signed)
Inpatient Diabetes Program Recommendations  AACE/ADA: New Consensus Statement on Inpatient Glycemic Control (2015)  Target Ranges:  Prepandial:   less than 140 mg/dL      Peak postprandial:   less than 180 mg/dL (1-2 hours)      Critically ill patients:  140 - 180 mg/dL   Lab Results  Component Value Date   GLUCAP 195 (H) 12/21/2019   HGBA1C 7.6 (H) 12/24/2019    Review of Glycemic Control  F/U with pt regarding new diagnosis DM2. Stressed importance of checking blood sugars at least 1x/day when discharged. Has PCP appt scheduled through [facility]. Instructed pt to call MD if blood sugars consistently > 200 mg/dL.   Again discussed how nutrition, exercise, stress, sickness and meds affect blood sugar control. Specifically discussed healthy diet and portion control with not skipping meals.   Answered questions. Pt has requested medical records prior to discharge.  Thank you. Ailene Ards, RD, LDN, CDE Inpatient Diabetes Coordinator [phone]

## 2019-12-29 NOTE — TOC Progression Note (Signed)
Transition of Care [facility]) - Progression Note    Patient Details  Name: Dillon Wolfe MRN: [id number] Date of Birth: July 30, 1970  Transition of Care [facility]) CM/SW Contact  Amada Jupiter, LCSW Phone Number: 12/29/2019, 2:56 PM  Clinical Narrative:    Medical team anticipating dc tomorrow.  Have contacted Jeri Modena with [facility] who will plan to see pt in the morning to REVIEW home IV antibiotic management and answer any concerns/ questions.  [facility] set to begin [facility] coverage on Saturday - I will review what to anticipate from these services.   Pt still very focused on financial concerns and have reminded him that [facility] dept is following him for [facility] care/ Medicaid eligibility.  Have, also, reminded him that I have referred him for new primary care at [facility] (appt 10/14 @ 9:30am).  Continue to follow.   Expected Discharge Plan: Home w Home Health Services Barriers to Discharge: Continued Medical Work up  Expected Discharge Plan and Services Expected Discharge Plan: Home w Home Health Services In-house Referral: Clinical Social Work     Living arrangements for the past 2 months: Single Family Home                 DME Arranged: N/A DME Agency: NA       HH Arranged: IV Antibiotics, RN HH Agency: [facility] Date HH Agency Contacted: 12/27/19 Time HH Agency Contacted: 1300 Representative spoke with at [facility] Agency: Kandee Keen   Social Determinants of Health (SDOH) Interventions    Readmission Risk Interventions No flowsheet data found.

## 2019-12-29 NOTE — Progress Notes (Signed)
Nephrology Follow-Up Consult note   Summary: Dillon Wolfe is a/an 49 y.o. male with no past medical history admitted for liver abscess complicated by AKI  Assess/Plan: Non-Oliguric AKI: admit creat was 1.6, no old creatinine. Pt w/ ATN due to contrast+ dehydration + hypotension/ sepsis. Creat peaked at 6.7 on 9/21 - creat down 5.7 yest and 5.0 today, good UOP, expect will cont to improve - pt will be dc'd tomorrow - pt has f/u appt w/ [facility] (see DC section) next week - doesn't need Na bicarb now, have dc'd - told pt to drink at least 1.5- 2 L fluids per day for next 2 weeks - don't take Advil / motrin/ alleve or any cousins for OTC pain, tylenol is okay - will sign off  Volume Status: as above, stable  Hypotension: resolved, 126/80   Liver abscess/ bacteroides sepsis: wound cx growing several different bacteria. Currently on IV Rocephin and po flagyl, to get 21 days total w/ home IV abx and po flagyl.   Leukocytosis improving.  Infectious disease and general surgery managing.  Anemia: Hemoglobin steady at 8.7 today.  Continue to monitor.  Kidney disease likely contributing minimally.  Hyperglycemia/Diabetes: Likely type 2 diabetes with hemoglobin A1c of 7.1. Seen by IM team, see their remarks, no need diab meds for now, f/u w/ PCP.  New PCP appt was made by primary team and CM.   Hypoalbuminemia: Improved to 2.5       Maree Krabbe [facility] 12/29/2019 4:30 PM  ___________________________________________________________  CC: AKI  Interval History/Subjective: feeling okay, no c/o's, no n/v or abd pain. Started tracking UOP and 7 L recorded today?? This may be a mistake. Wt's are down though. Creat up slightly.    Medications:  Current Facility-Administered Medications  Medication Dose Route Frequency Provider Last Rate Last Admin  . 0.9 %  sodium chloride infusion   Intravenous Continuous Delano Metz, MD 75 mL/hr at 12/29/19 1627 Rate  Change at 12/29/19 1627  . acetaminophen (TYLENOL) tablet 1,000 mg  1,000 mg Oral Q6H PRN Romie Levee, MD      . cefTRIAXone (ROCEPHIN) 2 g in sodium chloride 0.9 % 100 mL IVPB  2 g Intravenous Q24H Ginnie Smart, MD 200 mL/hr at 12/28/19 2133 2 g at 12/28/19 2133  . Chlorhexidine Gluconate Cloth 2 % PADS 6 each  6 each Topical Daily Drema Dallas, MD   6 each at 12/29/19 [phone]  . diphenhydrAMINE (BENADRYL) capsule 25 mg  25 mg Oral Q6H PRN Maczis, Elmer Sow, PA-C       Or  . diphenhydrAMINE (BENADRYL) injection 25 mg  25 mg Intravenous Q6H PRN Maczis, Elmer Sow, PA-C      . docusate sodium (COLACE) capsule 100 mg  100 mg Oral BID Romie Levee, MD   100 mg at 12/29/19 0933  . HYDROcodone-acetaminophen (NORCO/VICODIN) 5-325 MG per tablet 1-2 tablet  1-2 tablet Oral Q6H PRN Romie Levee, MD      . melatonin tablet 3 mg  3 mg Oral QHS Jacinto Halim, PA-C   3 mg at 12/27/19 2203  . metroNIDAZOLE (FLAGYL) tablet 500 mg  500 mg Oral Q8H Ginnie Smart, MD   500 mg at 12/29/19 1411  . morphine 2 MG/ML injection 2 mg  2 mg Intravenous Q2H PRN Romie Levee, MD      . ondansetron (ZOFRAN-ODT) disintegrating tablet 4 mg  4 mg Oral Q6H PRN Romie Levee, MD       Or  .  ondansetron (ZOFRAN) injection 4 mg  4 mg Intravenous Q6H PRN Romie Levee, MD      . protein supplement (ENSURE MAX) liquid  11 oz Oral Daily Andria Meuse, MD   11 oz at 12/29/19 0934  . simethicone (MYLICON) chewable tablet 40 mg  40 mg Oral Q6H PRN Romie Levee, MD      . sodium chloride flush (NS) 0.9 % injection 10-40 mL  10-40 mL Intracatheter PRN Drema Dallas, MD   10 mL at 12/27/19 0342  . sodium chloride flush (NS) 0.9 % injection 5 mL  5 mL Intracatheter Q8H Suttle, Thressa Sheller, MD   5 mL at 12/29/19 1412      Review of Systems: 10 systems reviewed and negative except per interval history/subjective  Physical Exam: Vitals:   12/29/19 0507 12/29/19 1334  BP: (!) 136/91 (!) 144/96  Pulse: 83  80  Resp: 18   Temp: 98.8 F (37.1 C) 98.9 F (37.2 C)  SpO2: 98% 100%   Total I/O In: 499.8 [I.V.:499.8] Out: -   Intake/Output Summary (Last 24 hours) at 12/29/2019 1630 Last data filed at 12/29/2019 1000 Gross per 24 hour  Intake 3326.56 ml  Output 1575 ml  Net 1751.56 ml   Constitutional: well-appearing, no acute distress ENMT: ears and nose without scars or lesions, MMM CV: normal rate, no edema Respiratory: Bilateral chest rise, normal work of breathing Gastrointestinal: soft, bowel sounds present Skin: no visible lesions or rashes Psych: alert, judgement/insight appropriate, appropriate mood and affect   Test Results I personally reviewed new and old clinical labs and radiology tests Lab Results  Component Value Date   NA 137 12/29/2019   K 3.8 12/29/2019   CL 107 12/29/2019   CO2 15 (L) 12/29/2019   BUN 59 (H) 12/29/2019   CREATININE 5.02 (H) 12/29/2019   CALCIUM 8.3 (L) 12/29/2019   ALBUMIN 2.5 (L) 12/27/2019

## 2019-12-29 NOTE — Progress Notes (Signed)
Explained to patient how to flush drain.  He did not want to do it himself.  He listened to instructions and observed while I flushed it.

## 2019-12-30 LAB — CBC
HCT: 26.2 % — ABNORMAL LOW (ref 39.0–52.0)
Hemoglobin: 8.4 g/dL — ABNORMAL LOW (ref 13.0–17.0)
MCH: 24.9 pg — ABNORMAL LOW (ref 26.0–34.0)
MCHC: 32.1 g/dL (ref 30.0–36.0)
MCV: 77.7 fL — ABNORMAL LOW (ref 80.0–100.0)
Platelets: 478 10*3/uL — ABNORMAL HIGH (ref 150–400)
RBC: 3.37 MIL/uL — ABNORMAL LOW (ref 4.22–5.81)
RDW: 16.2 % — ABNORMAL HIGH (ref 11.5–15.5)
WBC: 14.4 10*3/uL — ABNORMAL HIGH (ref 4.0–10.5)
nRBC: 0 % (ref 0.0–0.2)

## 2019-12-30 LAB — BASIC METABOLIC PANEL
Anion gap: 11 (ref 5–15)
BUN: 54 mg/dL — ABNORMAL HIGH (ref 6–20)
CO2: 17 mmol/L — ABNORMAL LOW (ref 22–32)
Calcium: 8.4 mg/dL — ABNORMAL LOW (ref 8.9–10.3)
Chloride: 110 mmol/L (ref 98–111)
Creatinine, Ser: 4.46 mg/dL — ABNORMAL HIGH (ref 0.61–1.24)
GFR calc Af Amer: 17 mL/min — ABNORMAL LOW (ref >60)
GFR calc non Af Amer: 14 mL/min — ABNORMAL LOW (ref >60)
Glucose, Bld: 83 mg/dL (ref 70–99)
Potassium: 4 mmol/L (ref 3.5–5.1)
Sodium: 138 mmol/L (ref 135–145)

## 2019-12-30 MED ORDER — ACETAMINOPHEN 500 MG PO TABS: 1000.0000 mg | ORAL_TABLET | Freq: Four times a day (QID) | ORAL | 0 refills | Status: DC | PRN

## 2019-12-30 MED ORDER — ACETAMINOPHEN 500 MG PO TABS
1000.0000 mg | ORAL_TABLET | Freq: Four times a day (QID) | ORAL | Status: DC | PRN
Start: 2019-12-30 — End: 2020-03-18

## 2019-12-30 MED ORDER — METRONIDAZOLE 500 MG PO TABS: 500.0000 mg | ORAL_TABLET | Freq: Three times a day (TID) | ORAL | 0 refills | Status: AC

## 2019-12-30 MED ORDER — SODIUM CHLORIDE 0.9 % IV SOLN: 2.0000 g | INTRAVENOUS | 0 refills | Status: AC

## 2019-12-30 MED ORDER — OXYCODONE HCL 5 MG PO TABS: 5.0000 mg | ORAL_TABLET | Freq: Four times a day (QID) | ORAL | 0 refills | Status: DC | PRN

## 2019-12-30 MED ORDER — CEFTRIAXONE IV (FOR PTA / DISCHARGE USE ONLY): 2.0000 g | INTRAVENOUS | 0 refills | Status: AC

## 2019-12-30 MED FILL — metroNIDAZOLE 500 MG TABS: 500 | 12 days supply | Qty: 36 | Fill #0

## 2019-12-30 NOTE — TOC Transition Note (Signed)
Transition of Care [facility]) - CM/SW Discharge Note   Patient Details  Name: Dillon Wolfe MRN: [id number] Date of Birth: 01-Oct-1970  Transition of Care [facility]) CM/SW Contact:  Amada Jupiter, LCSW Phone Number: 12/30/2019, 9:34 AM   Clinical Narrative:    Pt is medically cleared for dc today.  Review of abx management taking place now.  HH ready to make 1st visit tomorrow.  Pt instructed to fill his prescriptions at [facility] where he is establishing primary medical "home".  No further TOC needs.   Final next level of care: Home w Home Health Services Barriers to Discharge: Barriers Resolved   Patient Goals and CMS Choice Patient states their goals for this hospitalization and ongoing recovery are:: "to go home but I'm gonna need help paying for all this"      Discharge Placement                       Discharge Plan and Services In-house Referral: Clinical Social Work              DME Arranged: N/A DME Agency: NA       HH Arranged: IV Antibiotics, RN HH Agency: [facility] Date [facility] Agency Contacted: 12/27/19 Time HH Agency Contacted: 1300 Representative spoke with at [facility] Agency: Kandee Keen  Social Determinants of Health (SDOH) Interventions     Readmission Risk Interventions Readmission Risk Prevention Plan 12/30/2019  Post Dischage Appt Complete  Medication Screening Complete  Transportation Screening Complete  Some recent data might be hidden

## 2019-12-30 NOTE — Discharge Summary (Addendum)
Patient ID: Dillon Wolfe [id number] 13-Jan-1971 49 y.o.  Admit date: 12/21/2019 Discharge date: 12/30/2019  Admitting Diagnosis: Fall Large posterior liver abscess  Discharge Diagnosis Patient Active Problem List   Diagnosis Date Noted  . Liver abscess 12/21/2019  AKI  Type 2 DM - A1c 7.6 Bacteroides fragilis bacteremia RUQ abscess/multiple liver abscesses and 2 small splenic phlegmons, ? Secondary to perforated appendicitis vs gallbladder  Consultants Nephrology ID IR IM  Reason for Admission: Chief [name] a 49 y.o.malewho presents to the Emergency Department complaining of chills and fatigue.  He states that 2 weeks ago he fell and had been lying around at home due to pain in the right side of his back. His back pain is now resolved but he has been experiencing persistent diaphoresis and excessive thirst.  He reports 30 pounds weight loss over the last two weeks.  He also has nausea and has vomited twice in the last two weeks. Denies diarrhea, dysuria.  No known sick contacts.  No travel  Procedures Percutaneous drain placement by IR on 12/23/2019  Hospital Course:  AKI  The patient developed AKI with a peak in his Cr around 6.5 likely secondary to hypotension, sepsis, dehydration, and IV contrast.  Nephrology was consulted for assistance with management.  Ultimately, after plateauing, his creatinine began to fall.  It was 4.55 at time of discharge with good UOP.  Unclear what his baseline is, but admitting Cr was 1.71.  He will follow up with nephrology as an outpatient.  Likely type 2 DM  CBGs were elevated on admit.  His A1c was noted to be 7.6,.  Because of his kidney function and well controlled CBGs on diet management, etc, IM did not start him on treatment.  The patient has had education by the diabetes coordinators.  He would like to try and treat this with diet and lifestyle modification.  He has follow up arranged with [facility]  for close follow up.  Bacteroides fragilis bacteremia Blood cultures returned positive.  ID was consulted.  He was ultimately converted from zosyn to Rocephin (IV) and Flagyl (PO) with an end date of 10/6 pending further CT scans.  He will follow up with ID as an outpatient for further management and duration.  He has [facility] set up to assist with this.  RUQ abscess  The patient was found to have a large posterior liver abscess as well as several fluid collections within the liver.  The DDx was felt to be perforated gallbladder with intraperitoneal gallstone vs perforated appendicitis with fecalith.  IR was consulted for drain placement into this large abscess.  These cx revealed E. Coli with strep constellatus resistant to ampicillin.  See above for abx regimen.  The patient gradually improved.  His WBc remained in between 14-18K.  He was otherwise AF with no abdominal pain.  His diet was advanced as tolerated.  He underwent a repeat CT scan prior to discharge which revealed this large collection had mostly resolved.  The two areas in the liver were stable and there were two new very small areas of phlegmonous changes in the spleen. This scan really felt that this was secondary to appendicitis; however there is no way to truly confirm that.     The patient was otherwise stable on HD 9 for discharge home from all perspectives with extensive follow up arranged with ID, IR, renal, PCP, and surgery.  Physical Exam: Heart: regular Lungs: CTAB Abd: soft, NT, ND, drain just  emptied so no output currently  Allergies as of 12/30/2019   No Known Allergies     Medication List    STOP taking these medications   HYDROcodone-acetaminophen 5-325 MG tablet Commonly known as: NORCO/VICODIN     TAKE these medications   acetaminophen 500 MG tablet Commonly known as: TYLENOL Take 2 tablets (1,000 mg total) by mouth every 6 (six) hours as needed for headache. What changed:   reasons to take  this  additional instructions   acetaminophen 500 MG tablet Commonly known as: TYLENOL Take 2 tablets (1,000 mg total) by mouth every 6 (six) hours as needed. For pain What changed: You were already taking a medication with the same name, and this prescription was added. Make sure you understand how and when to take each.   blood glucose meter kit and supplies Dispense based on patient and insurance preference. Use up to four times daily as directed. (FOR ICD-10 E10.9, E11.9).   cefTRIAXone  IVPB Commonly known as: ROCEPHIN Inject 2 g into the vein daily for 12 days. Indication: Liver abscess First Dose: Yes Last Day of Therapy:  01/11/2020 Labs - Once weekly:  CBC/D and BMP, Labs - Every other week:  ESR and CRP Method of administration: IV Push Method of administration may be changed at the discretion of home infusion pharmacist based upon assessment of the patient and/or caregiver's ability to self-administer the medication ordered.   cefTRIAXone 2 g in sodium chloride 0.9 % 100 mL Inject 2 g into the vein daily for 12 days.   ibuprofen 800 MG tablet Commonly known as: ADVIL Take 1 tablet (800 mg total) by mouth 3 (three) times daily. What changed: when to take this   metroNIDAZOLE 500 MG tablet Commonly known as: FLAGYL Take 1 tablet (500 mg total) by mouth 3 (three) times daily for 12 days.   metroNIDAZOLE 500 MG tablet Commonly known as: FLAGYL Take 1 tablet (500 mg total) by mouth every 8 (eight) hours for 12 days.   oxyCODONE 5 MG immediate release tablet Commonly known as: Roxicodone Take 1 tablet (5 mg total) by mouth every 6 (six) hours as needed.            Durable Medical Equipment  (From admission, onward)         Start     Ordered   12/28/19 1356  DME Glucometer  Once        12/28/19 1356           Discharge Care Instructions  (From admission, onward)         Start     Ordered   12/30/19 0000  Change dressing on IV access line weekly and  PRN  (Home infusion instructions - [facility] )        12/30/19 0952            Follow-up Information    Care, [facility] Follow up.   Specialty: Home Health Services Why: to provide home health nursing visits Contact information: [place] [address] [phone]        Donato Heinz, MD. Go on 01/02/2020.   Specialty: Nephrology Why: appt with kidney doctor on Sept 27th at 1:30,  for follow up of acute kidney injury Contact information: [place] [address] [place] Follow up in 2 week(s).   Why: Please follow-up at drain clinic 2 weeks  after discharge. Our office will call you to set up this appointment. Contact information: [place] [address] [phone]        Campbell Riches, MD. Schedule an appointment as soon as possible for a visit in 2 week(s).   Specialty: Infectious Diseases Contact information: [place] [address] [phone]        Ileana Roup, MD Follow up in 2 week(s).   Specialty: General Surgery Contact information: [place] [address] [phone]        [facility] Follow up on 01/19/2020.   Why: 9:30am, arrive 15 minutes early.  This is your new primary care provider appointment to discuss your diabetes, etc Contact information: [place] [address] [phone]              Signed: Saverio Danker, [facility] 12/30/2019, 9:53 AM Please see Amion for pager number during day hours 7:00am-4:30pm, 7-11:30am on Weekends  Agree with above.  Alphonsa Overall, MD, [facility] Office phone:  [phone]

## 2019-12-31 LAB — PTH-RELATED PEPTIDE: PTH-related peptide: <2 pmol/L

## 2020-01-02 ENCOUNTER — Other Ambulatory Visit: Payer: Self-pay | Admitting: Surgery

## 2020-01-02 DIAGNOSIS — K750 Abscess of liver: Secondary | ICD-10-CM

## 2020-01-02 MED FILL — NORMAL SALINE FLUSH SYRINGE: 0.9 | 16 days supply | Qty: 160 | Fill #0

## 2020-01-17 ENCOUNTER — Ambulatory Visit (INDEPENDENT_AMBULATORY_CARE_PROVIDER_SITE_OTHER): Payer: Self-pay | Admitting: Infectious Diseases

## 2020-01-17 ENCOUNTER — Ambulatory Visit
Admission: RE | Admit: 2020-01-17 | Discharge: 2020-01-17 | Disposition: A | Discharge status: Home / Self Care | Payer: Self-pay | Source: Ambulatory Visit | Attending: Student | Admitting: Student

## 2020-01-17 ENCOUNTER — Ambulatory Visit
Admission: RE | Admit: 2020-01-17 | Discharge: 2020-01-17 | Disposition: A | Discharge status: Home / Self Care | Payer: Self-pay | Source: Ambulatory Visit | Attending: Surgery | Admitting: Surgery

## 2020-01-17 ENCOUNTER — Encounter: Payer: Self-pay | Admitting: Infectious Diseases

## 2020-01-17 ENCOUNTER — Other Ambulatory Visit: Payer: Self-pay

## 2020-01-17 DIAGNOSIS — N183 Chronic kidney disease, stage 3 unspecified: Secondary | ICD-10-CM

## 2020-01-17 DIAGNOSIS — E1122 Type 2 diabetes mellitus with diabetic chronic kidney disease: Secondary | ICD-10-CM

## 2020-01-17 DIAGNOSIS — K750 Abscess of liver: Secondary | ICD-10-CM

## 2020-01-17 DIAGNOSIS — E1169 Type 2 diabetes mellitus with other specified complication: Secondary | ICD-10-CM

## 2020-01-17 HISTORY — PX: IR RADIOLOGIST EVAL & MGMT: IMG5224

## 2020-01-17 MED ORDER — AMOXICILLIN-POT CLAVULANATE 500-125 MG PO TABS: 1.0000 | ORAL_TABLET | Freq: Two times a day (BID) | ORAL | 0 refills | Status: DC

## 2020-01-17 NOTE — Addendum Note (Signed)
Addended by: Eugune Sine C on: 01/17/2020 03:22 PM   Modules accepted: Orders

## 2020-01-17 NOTE — Assessment & Plan Note (Signed)
He will f/u with PCP.  

## 2020-01-17 NOTE — Assessment & Plan Note (Addendum)
He is doing well Wants to return to work in 2 weeks.  His CT scan is still pending, I called [place] rad. By report from tech- is better.  Depending on read, may give him f/u oral anbx.  He will rtc prn. He has f/u with surgery tomorrow by his account.   Addendum Pt with small residual areas of abscess. Will send in augmentin for 1 month.  Dose adjusted for Cr.  Pt called.

## 2020-01-17 NOTE — Progress Notes (Signed)
   Subjective:    Patient ID: Dillon Wolfe, male    DOB: 06-20-1970, 49 y.o.   MRN: [id number]  HPI 49 yo M with recent hospitalization (9-15 to 9-24) with Bacteroides fragilis bacteremia and a perforated appendix with liver abscess He had and IR drain in his liver which grew.  Cx Strep constallatus, E coli (r- amp/sulb).  He was treated with zosyn then changed to ceftriaxone/flagyl.   His course was also complicated by newly Dx DM2- A1C 7.6% as well as AKI. His anbx stop date was 10-6. His PIC has been last week. His liver drain was pulled today. He had f/u CT this am. Pending.  His anxiety is better.  Wants COVID vaccine today.   The past medical history, family history and social history were reviewed/updated in EPIC   Review of Systems  Constitutional: Negative for appetite change, chills, fever and unexpected weight change.       Has been more mindful of his diet.   Eyes: Negative for visual disturbance.  Gastrointestinal: Negative for abdominal pain, constipation and diarrhea.  Genitourinary: Negative for difficulty urinating.       Objective:   Physical Exam Vitals reviewed.  Constitutional:      Appearance: Normal appearance.  HENT:     Mouth/Throat:     Pharynx: No oropharyngeal exudate.  Eyes:     Extraocular Movements: Extraocular movements intact.     Pupils: Pupils are equal, round, and reactive to light.  Cardiovascular:     Rate and Rhythm: Normal rate and regular rhythm.  Pulmonary:     Effort: Pulmonary effort is normal.     Breath sounds: Normal breath sounds.  Abdominal:     General: Bowel sounds are normal. There is no distension.     Palpations: Abdomen is soft.     Tenderness: There is no abdominal tenderness.    Musculoskeletal:        General: Normal range of motion.       Arms:     Cervical back: Normal range of motion and neck supple.     Right lower leg: No edema.     Left lower leg: No edema.  Neurological:     General: No focal  deficit present.     Mental Status: He is alert.  Psychiatric:        Mood and Affect: Mood normal.        Behavior: Behavior normal.           Assessment & Plan:

## 2020-01-17 NOTE — Progress Notes (Signed)
Referring Physician(s): Dr. Dema Severin   Chief Complaint: The patient is seen in follow up today s/p RUQ hepatic abscess drain placed 12/23/19  History of present illness: Dillon Wolfe, 49 year old male, presented to the [facility] ED 12/21/19 with abdominal pain, nausea, chills and a 30 lb weight loss over the prior two weeks. CT abdomen/pelvis revealed a large RUQ abscess/multiple liver abscesses and IR was consulted for drain placement which was done 12/23/19. His blood cultures grew bacteroides fragilis; the abscess cultures grew E. Coli with strep constellatus. Infectious disease was consulted. The patient stabilized and was discharged home 12/30/19 with oral Flagyl.   The patient finished his course of antibiotics 01/11/20. He has been afebrile and has resume most of his normal activities. He states he has a good appetite and has no pain. He has not needed to take any of the pain medication he was prescribed at discharge.   No past medical history on file.  No past surgical history on file.  Allergies: Patient has no known allergies.  Medications: Prior to Admission medications   Medication Sig Start Date End Date Taking? Authorizing Provider  acetaminophen (TYLENOL) 500 MG tablet Take 2 tablets (1,000 mg total) by mouth every 6 (six) hours as needed for headache. 12/30/19   Saverio Danker, PA-C  acetaminophen (TYLENOL) 500 MG tablet Take 2 tablets (1,000 mg total) by mouth every 6 (six) hours as needed. For pain 12/30/19   Saverio Danker, PA-C  blood glucose meter kit and supplies Dispense based on patient and insurance preference. Use up to four times daily as directed. (FOR ICD-10 E10.9, E11.9). 12/29/19   Saverio Danker, PA-C  ibuprofen (ADVIL,MOTRIN) 800 MG tablet Take 1 tablet (800 mg total) by mouth 3 (three) times daily. Patient taking differently: Take 800 mg by mouth in the morning.  09/24/17   Deno Etienne, DO  oxyCODONE (ROXICODONE) 5 MG immediate release tablet Take 1 tablet (5 mg total)  by mouth every 6 (six) hours as needed. 12/30/19 12/29/20  Saverio Danker, PA-C     Family History  Problem Relation Age of Onset  . Obesity Mother   . Heart failure Mother   . Hypertension Mother   . Cancer Father     Social History   Socioeconomic History  . Marital status: Divorced    Spouse name: Not on file  . Number of children: Not on file  . Years of education: Not on file  . Highest education level: Not on file  Occupational History  . Not on file  Tobacco Use  . Smoking status: Light Tobacco Smoker    Types: Cigars  . Smokeless tobacco: Never Used  . Tobacco comment: black and milds when drinking  Vaping Use  . Vaping Use: Never used  Substance and Sexual Activity  . Alcohol use: Not Currently  . Drug use: No  . Sexual activity: Not on file  Other Topics Concern  . Not on file  Social History Narrative  . Not on file   Social Determinants of Health   Financial Resource Strain:   . Difficulty of Paying Living Expenses: Not on file  Food Insecurity:   . Worried About Charity fundraiser in the Last Year: Not on file  . Ran Out of Food in the Last Year: Not on file  Transportation Needs:   . Lack of Transportation (Medical): Not on file  . Lack of Transportation (Non-Medical): Not on file  Physical Activity:   . Days of Exercise per Week:  Not on file  . Minutes of Exercise per Session: Not on file  Stress:   . Feeling of Stress : Not on file  Social Connections:   . Frequency of Communication with Friends and Family: Not on file  . Frequency of Social Gatherings with Friends and Family: Not on file  . Attends Religious Services: Not on file  . Active Member of Clubs or Organizations: Not on file  . Attends Archivist Meetings: Not on file  . Marital Status: Not on file     Vital Signs: There were no vitals taken for this visit.  Physical Exam Constitutional:      General: He is not in acute distress.    Appearance: He is not  ill-appearing.  Pulmonary:     Effort: Pulmonary effort is normal.  Abdominal:     Palpations: Abdomen is soft.     Tenderness: There is no abdominal tenderness.     Comments: RUQ drain with gravity bag. Dressing is clean and dry. Stat lock and sutures intact. Skin insertion site is without erythema, drainage or tenderness. Approximately 10 cc of thin, clear, light yellow fluid in gravity bag.   Musculoskeletal:        General: Normal range of motion.  Skin:    General: Skin is warm and dry.  Neurological:     Mental Status: He is alert and oriented to person, place, and time.  Psychiatric:        Mood and Affect: Mood normal.        Behavior: Behavior normal.        Thought Content: Thought content normal.        Judgment: Judgment normal.     Imaging: No results found.  Labs:  CBC: Recent Labs    12/27/19 0341 12/28/19 0332 12/29/19 0305 12/30/19 0304  WBC 19.8* 16.9* 18.5* 14.4*  HGB 8.9* 8.6* 8.5* 8.4*  HCT 26.9* 26.5* 26.6* 26.2*  PLT 305 364 467* 478*    COAGS: Recent Labs    12/21/19 1420  INR 1.3*  APTT 45*    BMP: Recent Labs    12/27/19 0341 12/28/19 0332 12/29/19 0305 12/30/19 0304  NA 135 136 137 138  K 4.1 4.0 3.8 4.0  CL 105 106 107 110  CO2 18* 18* 15* 17*  GLUCOSE 109* 99 85 83  BUN 73* 71* 59* 54*  CALCIUM 8.5* 8.1* 8.3* 8.4*  CREATININE 6.72* 5.79* 5.02* 4.46*  GFRNONAA 9* 10* 12* 14*  GFRAA 10* 12* 14* 17*    LIVER FUNCTION TESTS: Recent Labs    12/24/19 0329 12/25/19 0306 12/26/19 0332 12/27/19 0341  BILITOT 1.6* 1.1 1.5* 1.1  AST 105* 62* 61* 52*  ALT 128* 89* 81* 73*  ALKPHOS 80 64 71 65  PROT 6.8 6.3* 7.2 6.9  ALBUMIN 1.8* 2.1* 2.7* 2.5*    Assessment:  RUQ hepatic abscess, S/p drain placement 12/23/19: Patient has had an an uneventful home recovery. CT imaging obtained today shows significant resolution of the RUQ abscess/multiple liver abscesses. He has been flushing the drain twice daily with 10 cc NS and his  output closely matches the volume of flush material. The patient has been afebrile and has resumed most of his normal activities. The patient has an appointment today with Dr. Johnnye Sima and an appointment tomorrow with Dr. Dema Severin. Given this information the decision was made to remove the drain and this was done without any complications. The site was covered with gauze and tape.  The patient is encouraged to keep all his upcoming appointments and he knows to call our clinic if he has any problems related to his drain site.    Signed: Theresa Duty, NP 01/17/2020, 1:31 PM   Please refer to Dr. Margaretmary Dys attestation of this note for management and plan.

## 2020-01-17 NOTE — Assessment & Plan Note (Signed)
He needs f/u with renal, I will request this.

## 2020-01-19 ENCOUNTER — Encounter: Payer: Self-pay | Admitting: Family Medicine

## 2020-01-19 ENCOUNTER — Other Ambulatory Visit: Payer: Self-pay

## 2020-01-19 ENCOUNTER — Ambulatory Visit: Payer: MEDICAID | Attending: Family Medicine | Admitting: Family Medicine

## 2020-01-19 DIAGNOSIS — K750 Abscess of liver: Secondary | ICD-10-CM

## 2020-01-19 DIAGNOSIS — N183 Chronic kidney disease, stage 3 unspecified: Secondary | ICD-10-CM

## 2020-01-19 DIAGNOSIS — E1169 Type 2 diabetes mellitus with other specified complication: Secondary | ICD-10-CM

## 2020-01-19 DIAGNOSIS — E1122 Type 2 diabetes mellitus with diabetic chronic kidney disease: Secondary | ICD-10-CM

## 2020-01-19 NOTE — Progress Notes (Signed)
Virtual Visit via Telephone Note  I connected with Dillon Wolfe, on 01/19/2020 at 9:45 AM by telephone due to the COVID-19 pandemic and verified that I am speaking with the correct person using two identifiers.   Consent: I discussed the limitations, risks, security and privacy concerns of performing an evaluation and management service by telephone and the availability of in person appointments. I also discussed with the patient that there may be a patient responsible charge related to this service. The patient expressed understanding and agreed to proceed.   Location of Patient: Home  Location of Provider: Clinic   Persons participating in Telemedicine visit: Fleet Higham Farrington-CMA Dr. Margarita Rana     History of Present Illness: Dillon Wolfe is a 49 year old male who presents today to establish care.  He was hospitalized at [facility] from 12/21/2019 through 12/30/2019 for liver abscesses secondary to perforated appendicitis versus gallbladder. He underwent IR drainage of abscess, culture was positive for E. coli. Hospital course was complicated by acute kidney injury with Cr trending up to 6.5 thought to be secondary to a combination of sepsis, hypotension and IV contrast.  Found to also have an elevated A1c of 7.6 and no medication was initiated. Creatinine trended down to 1.71 at discharge.  Since discharge he has followed up with infectious disease.  Was seen by Dr. Johnnye Sima 2 days ago and CT scan revealed: IMPRESSION: Resolution of the large subcapsular collection along the inferior tip of the right lobe of the liver. There are 2 small areas of residual air and edema/fluid within the right lobe parenchyma which are removed from the percutaneous drain and show significant decrease in size since the prior study. No new intrahepatic collections are identified.  He was prescribed Augmentin which she is currently taking and I was also provided a letter  to return to work on 01/31/2020 by infectious disease. Today he states he feels fine and has no abdominal pain, back pain, fever, nausea or vomiting.   He smokes cigars but not cigarettes and states he smokes this only on special occasions.  No past medical history on file. No Known Allergies  Current Outpatient Medications on File Prior to Visit  Medication Sig Dispense Refill  . amoxicillin-clavulanate (AUGMENTIN) 500-125 MG tablet Take 1 tablet (500 mg total) by mouth in the morning and at bedtime. 60 tablet 0  . acetaminophen (TYLENOL) 500 MG tablet Take 2 tablets (1,000 mg total) by mouth every 6 (six) hours as needed for headache. (Patient not taking: Reported on 01/17/2020) 30 tablet 0  . acetaminophen (TYLENOL) 500 MG tablet Take 2 tablets (1,000 mg total) by mouth every 6 (six) hours as needed. For pain (Patient not taking: Reported on 01/17/2020) 30 tablet   . blood glucose meter kit and supplies Dispense based on patient and insurance preference. Use up to four times daily as directed. (FOR ICD-10 E10.9, E11.9). (Patient not taking: Reported on 01/17/2020) 1 each 0  . ibuprofen (ADVIL,MOTRIN) 800 MG tablet Take 1 tablet (800 mg total) by mouth 3 (three) times daily. (Patient not taking: Reported on 01/17/2020) 21 tablet 0  . oxyCODONE (ROXICODONE) 5 MG immediate release tablet Take 1 tablet (5 mg total) by mouth every 6 (six) hours as needed. (Patient not taking: Reported on 01/17/2020) 10 tablet 0   No current facility-administered medications on file prior to visit.    Observations/Objective: Awake, alert, oriented x3 Not in acute distress  Lab Results  Component Value Date   HGBA1C 7.6 (H) 12/24/2019  CMP Latest Ref Rng & Units 12/30/2019 12/29/2019 12/28/2019  Glucose 70 - 99 mg/dL 83 85 99  BUN 6 - 20 mg/dL 54(H) 59(H) 71(H)  Creatinine 0.61 - 1.24 mg/dL 4.46(H) 5.02(H) 5.79(H)  Sodium 135 - 145 mmol/L 138 137 136  Potassium 3.5 - 5.1 mmol/L 4.0 3.8 4.0  Chloride 98  - 111 mmol/L 110 107 106  CO2 22 - 32 mmol/L 17(L) 15(L) 18(L)  Calcium 8.9 - 10.3 mg/dL 8.4(L) 8.3(L) 8.1(L)  Total Protein 6.5 - 8.1 g/dL - - -  Total Bilirubin 0.3 - 1.2 mg/dL - - -  Alkaline Phos 38 - 126 U/L - - -  AST 15 - 41 U/L - - -  ALT 0 - 44 U/L - - -     CBC    Component Value Date/Time   WBC 14.4 (H) 12/30/2019 0304   RBC 3.37 (L) 12/30/2019 0304   HGB 8.4 (L) 12/30/2019 0304   HCT 26.2 (L) 12/30/2019 0304   PLT 478 (H) 12/30/2019 0304   MCV 77.7 (L) 12/30/2019 0304   MCH 24.9 (L) 12/30/2019 0304   MCHC 32.1 12/30/2019 0304   RDW 16.2 (H) 12/30/2019 0304   LYMPHSABS 1.5 12/24/2019 0329   MONOABS 1.2 (H) 12/24/2019 0329   EOSABS 0.0 12/24/2019 0329   BASOSABS 0.1 12/24/2019 0329     Assessment and Plan: 1. Type 2 diabetes mellitus with other specified complication, without long-term current use of insulin ([place]) This seems to be a new diagnosis with A1c of 7.6 No medication was initiated He is currently complaining about the cost of medications and I have described the financial assistant process to him which he informs me he is already aware of. We will bring into the office for an in person visit to discuss management of diabetes and initiation of medication  2. Liver abscess Improving Currently on Augmentin Would love to repeat WBC however he is opposed to this as he does not want to incur additional bills. I have explained to him that once he is approved for financial assistance this would take care of his bills but he is not open to this  3. CKD stage 3 due to type 2 diabetes mellitus ([place]) Discharge creatinine was 1.7 Would love to repeat labs and I have informed him accordingly however he declines.   Follow Up Instructions: Return in about 1 month (around 02/19/2020) for hyperglycemia.    I discussed the assessment and treatment plan with the patient. The patient was provided an opportunity to ask questions and all were answered. The patient  agreed with the plan and demonstrated an understanding of the instructions.   The patient was advised to call back or seek an in-person evaluation if the symptoms worsen or if the condition fails to improve as anticipated.     I provided 21 minutes total of non-face-to-face time during this encounter including median intraservice time, reviewing previous notes, investigations, ordering medications, medical decision making, coordinating care and patient verbalized understanding at the end of the visit.     Charlott Rakes, MD, FAAFP. [facility] and [place] [place], [place]   01/19/2020, 9:45 AM

## 2020-01-19 NOTE — Progress Notes (Signed)
Patient is wanting to know when he can return to work.

## 2020-01-20 ENCOUNTER — Encounter: Payer: Self-pay | Admitting: Infectious Diseases

## 2020-02-20 ENCOUNTER — Ambulatory Visit: Payer: Self-pay | Admitting: Family Medicine

## 2020-03-16 ENCOUNTER — Emergency Department (HOSPITAL_COMMUNITY)
Admission: EM | Admit: 2020-03-16 | Discharge: 2020-03-17 | Disposition: A | Discharge status: Home / Self Care | Payer: Medicaid Other | Attending: Emergency Medicine | Admitting: Emergency Medicine

## 2020-03-16 ENCOUNTER — Other Ambulatory Visit: Payer: Self-pay

## 2020-03-16 ENCOUNTER — Encounter (HOSPITAL_COMMUNITY): Payer: Self-pay | Admitting: Emergency Medicine

## 2020-03-16 ENCOUNTER — Emergency Department (HOSPITAL_COMMUNITY): Payer: Medicaid Other

## 2020-03-16 DIAGNOSIS — Y990 Civilian activity done for income or pay: Secondary | ICD-10-CM | POA: Diagnosis not present

## 2020-03-16 DIAGNOSIS — Z5321 Procedure and treatment not carried out due to patient leaving prior to being seen by health care provider: Secondary | ICD-10-CM | POA: Diagnosis not present

## 2020-03-16 DIAGNOSIS — S6991XA Unspecified injury of right wrist, hand and finger(s), initial encounter: Secondary | ICD-10-CM | POA: Diagnosis not present

## 2020-03-16 DIAGNOSIS — W228XXA Striking against or struck by other objects, initial encounter: Secondary | ICD-10-CM | POA: Diagnosis not present

## 2020-03-16 NOTE — ED Triage Notes (Signed)
Patient reports injury to right hand at work today , he accidentally hit it against a pallet with mild swelling .

## 2020-03-16 NOTE — ED Notes (Signed)
Called back for triage x2 no answer.

## 2020-03-18 ENCOUNTER — Other Ambulatory Visit: Payer: Self-pay

## 2020-03-18 ENCOUNTER — Encounter (HOSPITAL_COMMUNITY): Payer: Self-pay | Admitting: Emergency Medicine

## 2020-03-18 ENCOUNTER — Ambulatory Visit (HOSPITAL_COMMUNITY)
Admission: EM | Admit: 2020-03-18 | Discharge: 2020-03-18 | Disposition: A | Discharge status: Home / Self Care | Payer: Worker's Compensation | Attending: Family Medicine | Admitting: Family Medicine

## 2020-03-18 DIAGNOSIS — M1990 Unspecified osteoarthritis, unspecified site: Secondary | ICD-10-CM | POA: Diagnosis not present

## 2020-03-18 MED ORDER — PREDNISONE 10 MG (21) PO TBPK: ORAL_TABLET | ORAL | 0 refills | Status: DC

## 2020-03-18 NOTE — Discharge Instructions (Addendum)
Prednisone taper over the next 6 days.  Take this with food.  Do not take any other NSAIDs while taking this.  He can continue ibuprofen as needed after the prednisone is finished

## 2020-03-18 NOTE — ED Provider Notes (Signed)
[facility]    CSN: [id number] Arrival date & time: 03/18/20  1331      History   Chief Complaint Chief Complaint  Patient presents with  . Hand Injury    HPI Dillon Wolfe is a 49 y.o. male.   Patient is a 49 year old male past medical history of type 2 diabetes, CKD, liver abscess.  He presents today with right hand pain, swelling.  Reporting that Friday at work he had pain on the palate.  Went to [facility], ED where he had x-ray done but was never seen by a physician due to wait time.  He has been taking ibuprofen as needed for pain which seems to help.  He initially did ice to the hand.  No numbness, tingling.      History reviewed. No pertinent past medical history.  Patient Active Problem List   Diagnosis Date Noted  . Type 2 diabetes mellitus with other specified complication (HCC) 01/17/2020  . CKD stage 3 due to type 2 diabetes mellitus (HCC) 01/17/2020  . Liver abscess 12/21/2019    Past Surgical History:  Procedure Laterality Date  . IR RADIOLOGIST EVAL & MGMT  01/17/2020       Home Medications    Prior to Admission medications   Medication Sig Start Date End Date Taking? Authorizing Provider  ibuprofen (ADVIL,MOTRIN) 800 MG tablet Take 1 tablet (800 mg total) by mouth 3 (three) times daily. Patient not taking: No sig reported 09/24/17   Melene Plan, DO  predniSONE (STERAPRED UNI-PAK 21 TAB) 10 MG (21) TBPK tablet 6 tabs for 1 day, then 5 tabs for 1 das, then 4 tabs for 1 day, then 3 tabs for 1 day, 2 tabs for 1 day, then 1 tab for 1 day 03/18/20   Janace Aris, NP    Family History Family History  Problem Relation Age of Onset  . Obesity Mother   . Heart failure Mother   . Hypertension Mother   . Cancer Father     Social History Social History   Tobacco Use  . Smoking status: Light Tobacco Smoker    Types: Cigars  . Smokeless tobacco: Never Used  . Tobacco comment: black and milds when drinking  Vaping Use  . Vaping Use:  Never used  Substance Use Topics  . Alcohol use: Not Currently    Comment: Socially  . Drug use: No     Allergies   Patient has no known allergies.   Review of Systems Review of Systems   Physical Exam Triage Vital Signs ED Triage Vitals  Enc Vitals Group     BP 03/18/20 1358 (!) 144/88     Pulse Rate 03/18/20 1358 89     Resp 03/18/20 1358 18     Temp 03/18/20 1358 98.7 F (37.1 C)     Temp Source 03/18/20 1358 Oral     SpO2 03/18/20 1358 99 %     Weight --      Height --      Head Circumference --      Peak Flow --      Pain Score 03/18/20 1355 4     Pain Loc --      Pain Edu? --      Excl. in GC? --    No data found.  Updated Vital Signs BP (!) 144/88 (BP Location: Left Arm)   Pulse 89   Temp 98.7 F (37.1 C) (Oral)   Resp 18  SpO2 99%   Visual Acuity Right Eye Distance:   Left Eye Distance:   Bilateral Distance:    Right Eye Near:   Left Eye Near:    Bilateral Near:     Physical Exam Vitals and nursing note reviewed.  Constitutional:      Appearance: Normal appearance.  HENT:     Head: Normocephalic and atraumatic.     Nose: Nose normal.  Eyes:     Conjunctiva/sclera: Conjunctivae normal.  Pulmonary:     Effort: Pulmonary effort is normal.  Musculoskeletal:        General: Normal range of motion.       Hands:     Cervical back: Normal range of motion.  Skin:    General: Skin is warm and dry.  Neurological:     Mental Status: He is alert.  Psychiatric:        Mood and Affect: Mood normal.      UC Treatments / Results  Labs (all labs ordered are listed, but only abnormal results are displayed) Labs Reviewed - No data to display  EKG   Radiology DG Hand Complete Right  Result Date: 03/16/2020 CLINICAL DATA:  Hand injury pain at the third metacarpal EXAM: RIGHT HAND - COMPLETE 3+ VIEW COMPARISON:  None. FINDINGS: There is no evidence of fracture or dislocation. There is no evidence of arthropathy or other focal bone  abnormality. Soft tissues are unremarkable. IMPRESSION: Negative. Electronically Signed   By: Jasmine Pang M.D.   On: 03/16/2020 23:16    Procedures Procedures (including critical care time)  Medications Ordered in UC Medications - No data to display  Initial Impression / Assessment and Plan / UC Course  I have reviewed the triage vital signs and the nursing notes.  Pertinent labs & imaging results that were available during my care of the patient were reviewed by me and considered in my medical decision making (see chart for details).     Arthritis Symptoms and exam consistent with this. X-ray revealed no acute findings We will treat with prednisone taper over the next 6 days.  Ice the hand Work note given for rest Final Clinical Impressions(s) / UC Diagnoses   Final diagnoses:  Arthritis     Discharge Instructions     Prednisone taper over the next 6 days.  Take this with food.  Do not take any other NSAIDs while taking this.  He can continue ibuprofen as needed after the prednisone is finished    ED Prescriptions    Medication Sig Dispense Auth. Provider   predniSONE (STERAPRED UNI-PAK 21 TAB) 10 MG (21) TBPK tablet 6 tabs for 1 day, then 5 tabs for 1 das, then 4 tabs for 1 day, then 3 tabs for 1 day, 2 tabs for 1 day, then 1 tab for 1 day 21 tablet Lola Lofaro A, NP     PDMP not reviewed this encounter.   Janace Aris, NP 03/18/20 1447

## 2020-03-18 NOTE — ED Triage Notes (Signed)
Pt c/o right hand swelling onset Friday after he hit a pallet on accident. Pt states he was seen at [facility] ED. Pt states he took 5 ibuprofen on Friday night and the swelling went down.

## 2020-09-02 ENCOUNTER — Other Ambulatory Visit: Payer: Self-pay

## 2020-09-02 ENCOUNTER — Ambulatory Visit (HOSPITAL_COMMUNITY)
Admission: EM | Admit: 2020-09-02 | Discharge: 2020-09-02 | Disposition: A | Discharge status: Home / Self Care | Payer: Medicaid Other | Attending: Student | Admitting: Student

## 2020-09-02 ENCOUNTER — Encounter (HOSPITAL_COMMUNITY): Payer: Self-pay

## 2020-09-02 DIAGNOSIS — L255 Unspecified contact dermatitis due to plants, except food: Secondary | ICD-10-CM | POA: Diagnosis not present

## 2020-09-02 MED ORDER — METHYLPREDNISOLONE SODIUM SUCC 125 MG IJ SOLR
60.0000 mg | Freq: Once | INTRAMUSCULAR | Status: AC
  Administered 2020-09-02: 60 mg via INTRAMUSCULAR

## 2020-09-02 MED ORDER — PREDNISONE 10 MG (21) PO TBPK: ORAL_TABLET | Freq: Every day | ORAL | 0 refills | Status: DC

## 2020-09-02 MED ORDER — METHYLPREDNISOLONE SODIUM SUCC 125 MG IJ SOLR
INTRAMUSCULAR | Status: AC
  Filled 2020-09-02: qty 2

## 2020-09-02 NOTE — ED Triage Notes (Signed)
Pt present poison Ivy that has spread to his legs and arms and hands. Pt was doing yard work yesterday and got into some poison ivy.

## 2020-09-02 NOTE — Discharge Instructions (Addendum)
-  Prednisone taper for poison ivy. I recommend taking this in the morning as it could give you energy.  -Benadryl can help with symptomatic relief of the itching. -You can also use calamine lotion. -Seek additional medical attention if your symptoms worsen or persist, or new symptoms like fevers and chills, redness surrounding the poison ivy, etc.

## 2020-09-02 NOTE — ED Provider Notes (Signed)
[facility]    CSN: [id number] Arrival date & time: 09/02/20  1626      History   Chief Complaint Chief Complaint  Patient presents with  . Poison Ivy    HPI Dillon Wolfe is a 50 y.o. male presenting with poison ivy on legs, arms, hands.  States he got this from doing yard work one day ago.  Medical history CKD, type 2 diabetes- pt states diabetes is "borderline" and diet controlled. Has been using calamine lotion with minimal improvement. Describes rash as very itchy with vesicles. Some have popped leaking clear fluid. Denies fevers/chills.  HPI  History reviewed. No pertinent past medical history.  Patient Active Problem List   Diagnosis Date Noted  . Type 2 diabetes mellitus with other specified complication (HCC) 01/17/2020  . CKD stage 3 due to type 2 diabetes mellitus (HCC) 01/17/2020  . Liver abscess 12/21/2019    Past Surgical History:  Procedure Laterality Date  . IR RADIOLOGIST EVAL & MGMT  01/17/2020       Home Medications    Prior to Admission medications   Medication Sig Start Date End Date Taking? Authorizing Provider  predniSONE (STERAPRED UNI-PAK 21 TAB) 10 MG (21) TBPK tablet Take by mouth daily. Take 6 tabs by mouth daily  for 2 days, then 5 tabs for 2 days, then 4 tabs for 2 days, then 3 tabs for 2 days, 2 tabs for 2 days, then 1 tab by mouth daily for 2 days 09/02/20  Yes Rhys Martini, PA-C    Family History Family History  Problem Relation Age of Onset  . Obesity Mother   . Heart failure Mother   . Hypertension Mother   . Cancer Father     Social History Social History   Tobacco Use  . Smoking status: Light Tobacco Smoker    Types: Cigars  . Smokeless tobacco: Never Used  . Tobacco comment: black and milds when drinking  Vaping Use  . Vaping Use: Never used  Substance Use Topics  . Alcohol use: Not Currently    Comment: Socially  . Drug use: No     Allergies   Patient has no known allergies.   Review of  Systems Review of Systems  Skin: Positive for rash.  All other systems reviewed and are negative.    Physical Exam Triage Vital Signs ED Triage Vitals  Enc Vitals Group     BP 09/02/20 1728 (!) 153/110     Pulse Rate 09/02/20 1728 99     Resp 09/02/20 1728 16     Temp 09/02/20 1728 98.9 F (37.2 C)     Temp Source 09/02/20 1728 Oral     SpO2 09/02/20 1728 99 %     Weight --      Height --      Head Circumference --      Peak Flow --      Pain Score 09/02/20 1726 0     Pain Loc --      Pain Edu? --      Excl. in [place]? --    No data found.  Updated Vital Signs BP (!) 153/110 (BP Location: Right Arm)   Pulse 99   Temp 98.9 F (37.2 C) (Oral)   Resp 16   SpO2 99%   Visual Acuity Right Eye Distance:   Left Eye Distance:   Bilateral Distance:    Right Eye Near:   Left Eye Near:    Bilateral Near:  Physical Exam Vitals reviewed.  Constitutional:      General: He is not in acute distress.    Appearance: Normal appearance. He is not ill-appearing or diaphoretic.  HENT:     Head: Normocephalic and atraumatic.  Cardiovascular:     Rate and Rhythm: Normal rate and regular rhythm.     Heart sounds: Normal heart sounds.  Pulmonary:     Effort: Pulmonary effort is normal.     Breath sounds: Normal breath sounds.  Skin:    General: Skin is warm.     Capillary Refill: Capillary refill takes less than 2 seconds.     Comments: Urticarial rash with scattered vesicles and bullae on - Bilateral ankles, L posterior knee. Few vesicles have popped, leaking clear fluid. No warmth. No facial involvement. No facial, lip, tongue, pharyngeal, uvula swelling.   Neurological:     General: No focal deficit present.     Mental Status: He is alert and oriented to person, place, and time.  Psychiatric:        Mood and Affect: Mood normal.        Behavior: Behavior normal.        Thought Content: Thought content normal.        Judgment: Judgment normal.      UC Treatments /  Results  Labs (all labs ordered are listed, but only abnormal results are displayed) Labs Reviewed - No data to display  EKG   Radiology No results found.  Procedures Procedures (including critical care time)  Medications Ordered in UC Medications  methylPREDNISolone sodium succinate (SOLU-MEDROL) 125 mg/2 mL injection 60 mg (60 mg Intramuscular Given 09/02/20 1843)    Initial Impression / Assessment and Plan / UC Course  I have reviewed the triage vital signs and the nursing notes.  Pertinent labs & imaging results that were available during my care of the patient were reviewed by me and considered in my medical decision making (see chart for details).     *This patient is a 50 year old male presenting with poison ivy.  Afebrile, nontachycardic, oxygenating well on room air.  No facial or pharyngeal swelling.  Plan to treat with prednisone taper as below.  Also administered Solu-Medrol today.  Chart review shows history of diabetes but patient states this is borderline and diet controlled.  ED return precautions discussed Final Clinical Impressions(s) / UC Diagnoses   Final diagnoses:  Rhus dermatitis     Discharge Instructions     -Prednisone taper for poison ivy. I recommend taking this in the morning as it could give you energy.  -Benadryl can help with symptomatic relief of the itching. -You can also use calamine lotion. -Seek additional medical attention if your symptoms worsen or persist, or new symptoms like fevers and chills, redness surrounding the poison ivy, etc.     ED Prescriptions    Medication Sig Dispense Auth. Provider   predniSONE (STERAPRED UNI-PAK 21 TAB) 10 MG (21) TBPK tablet Take by mouth daily. Take 6 tabs by mouth daily  for 2 days, then 5 tabs for 2 days, then 4 tabs for 2 days, then 3 tabs for 2 days, 2 tabs for 2 days, then 1 tab by mouth daily for 2 days 42 tablet Rhys Martini, PA-C     PDMP not reviewed this encounter.   Rhys Martini, PA-C 09/03/20 [id number]

## 2021-08-13 ENCOUNTER — Encounter (HOSPITAL_COMMUNITY): Payer: Self-pay | Admitting: Emergency Medicine

## 2021-08-13 ENCOUNTER — Other Ambulatory Visit: Payer: Self-pay

## 2021-08-13 ENCOUNTER — Ambulatory Visit (HOSPITAL_COMMUNITY)
Admission: EM | Admit: 2021-08-13 | Discharge: 2021-08-13 | Disposition: A | Discharge status: Home / Self Care | Payer: Medicaid Other

## 2021-08-13 ENCOUNTER — Ambulatory Visit (INDEPENDENT_AMBULATORY_CARE_PROVIDER_SITE_OTHER): Payer: Medicaid Other

## 2021-08-13 DIAGNOSIS — S96911A Strain of unspecified muscle and tendon at ankle and foot level, right foot, initial encounter: Secondary | ICD-10-CM

## 2021-08-13 DIAGNOSIS — M25471 Effusion, right ankle: Secondary | ICD-10-CM

## 2021-08-13 DIAGNOSIS — M7989 Other specified soft tissue disorders: Secondary | ICD-10-CM | POA: Diagnosis not present

## 2021-08-13 DIAGNOSIS — M25571 Pain in right ankle and joints of right foot: Secondary | ICD-10-CM

## 2021-08-13 MED ORDER — IBUPROFEN 800 MG PO TABS: 800.0000 mg | ORAL_TABLET | Freq: Three times a day (TID) | ORAL | 0 refills | Status: DC

## 2021-08-13 NOTE — ED Triage Notes (Signed)
Tripped going down a hill yesterday.  Today has pain in right ankle.  Slight swelling in right ankle, throbbing pain in ankle.  Pedal pulses 2 +.   ?

## 2021-08-13 NOTE — Discharge Instructions (Signed)
Advised elevation, ice frequently to area: 10 minutes on 20 minutes off 3-4 times a day over the next 2 to 3 days. ?Ibuprofen as needed for pain. ?Follow-up with PCP or urgent care if symptoms fail to improve in the next 3 to 4 weeks. ?

## 2021-08-13 NOTE — ED Provider Notes (Signed)
?[facility] ? ? ? ?CSN: [id number] ?Arrival date & time: 08/13/21  1053 ? ? ?  ? ?History   ?Chief Complaint ?Chief Complaint  ?Patient presents with  ? Ankle Pain  ? ? ?HPI ?Dillon Wolfe is a 51 y.o. male.  ? ?51 year old male presents with right ankle pain patient indicates that he was fishing yesterday walking down the hill when he tripped and his right ankle inverted.  Patient indicates since then he has had right ankle pain, swelling, and tenderness.  Patient indicates that he has having pain of the right ankle when he bears weight and tries to walk.  Patient decays he is also had swelling of the right ankle lateral and medial aspects.  Patient has taken OTC ibuprofen without relief.  Patient is concerned that he may have broken a bone and is presently for evaluation patient denies any numbness, tingling, or weakness. ? ? ?Ankle Pain ? ?History reviewed. No pertinent past medical history. ? ?Patient Active Problem List  ? Diagnosis Date Noted  ? Type 2 diabetes mellitus with other specified complication ([place]) [id number]  ? CKD stage 3 due to type 2 diabetes mellitus ([place]) 01/17/2020  ? Liver abscess 12/21/2019  ? ? ?Past Surgical History:  ?Procedure Laterality Date  ? IR RADIOLOGIST EVAL & MGMT  01/17/2020  ? ? ? ? ? ?Home Medications   ? ?Prior to Admission medications   ?Medication Sig Start Date End Date Taking? Authorizing Provider  ?ibuprofen (ADVIL) 800 MG tablet Take 1 tablet (800 mg total) by mouth 3 (three) times daily. 08/13/21  Yes Nyoka Lint, PA-C  ?predniSONE (STERAPRED UNI-PAK 21 TAB) 10 MG (21) TBPK tablet Take by mouth daily. Take 6 tabs by mouth daily  for 2 days, then 5 tabs for 2 days, then 4 tabs for 2 days, then 3 tabs for 2 days, 2 tabs for 2 days, then 1 tab by mouth daily for 2 days ?Patient not taking: Reported on 08/13/2021 09/02/20   Hazel Sams, PA-C  ? ? ?Family History ?Family History  ?Problem Relation Age of Onset  ? Obesity Mother   ? Heart failure Mother   ?  Hypertension Mother   ? Cancer Father   ? ? ?Social History ?Social History  ? ?Tobacco Use  ? Smoking status: Light Smoker  ?  Types: Cigars  ? Smokeless tobacco: Never  ? Tobacco comments:  ?  black and milds when drinking  ?Vaping Use  ? Vaping Use: Never used  ?Substance Use Topics  ? Alcohol use: Not Currently  ?  Comment: Socially  ? Drug use: No  ? ? ? ?Allergies   ?Ivp dye [iodinated contrast media] ? ? ?Review of Systems ?Review of Systems  ?Musculoskeletal:  Positive for arthralgias (right ankle pain.).  ? ? ?Physical Exam ?Triage Vital Signs ?ED Triage Vitals  ?Enc Vitals Group  ?   BP 08/13/21 1201 (!) 133/92  ?   Pulse Rate 08/13/21 1201 83  ?   Resp 08/13/21 1201 20  ?   Temp 08/13/21 1201 98.4 ?F (36.9 ?C)  ?   Temp Source 08/13/21 1201 Oral  ?   SpO2 08/13/21 1201 98 %  ?   Weight --   ?   Height --   ?   Head Circumference --   ?   Peak Flow --   ?   Pain Score 08/13/21 1158 8  ?   Pain Loc --   ?   Pain  Edu? --   ?   Excl. in [place]? --   ? ?No data found. ? ?Updated Vital Signs ?BP (!) 133/92 (BP Location: Left Arm) Comment (BP Location): large cuff  Pulse 83   Temp 98.4 ?F (36.9 ?C) (Oral)   Resp 20   SpO2 98%  ? ?Visual Acuity ?Right Eye Distance:   ?Left Eye Distance:   ?Bilateral Distance:   ? ?Right Eye Near:   ?Left Eye Near:    ?Bilateral Near:    ? ?Physical Exam ?Constitutional:   ?   Appearance: Normal appearance.  ?Musculoskeletal:  ?   Right ankle: Swelling present. Tenderness present over the lateral malleolus and medial malleolus.  ?   Comments: Right ankle: 1-2+ swelling noted on the medial and lateral aspects of the malleolus.  Range of motion is intact but limited on flexion and extension.  Stability is intact.  Pain on valgus stressing.  No palpation of the medial malleolus area.  It is noted.  ?Neurological:  ?   Mental Status: He is alert.  ? ? ? ?UC Treatments / Results  ?Labs ?(all labs ordered are listed, but only abnormal results are displayed) ?Labs Reviewed - No data to  display ? ?EKG ? ? ?Radiology ?DG Ankle Complete Right ? ?Result Date: 08/13/2021 ?CLINICAL DATA:  Right ankle pain after tripping while walking down a hill yesterday EXAM: RIGHT ANKLE - COMPLETE 3+ VIEW COMPARISON:  None Available. FINDINGS: Diffuse soft tissue swelling about the ankle. No evidence of acute fracture or malalignment. The ankle mortise is congruent and the talar dome appears intact. Degenerative changes present at the anterior tibiotalar joint with several small round ossific bodies which may reflect loose bodies. Prominent os trigonum. No lytic or blastic osseous lesion. IMPRESSION: 1. No evidence of acute fracture. 2. Diffuse soft tissue swelling about the ankle suggestive of edema/sprain. 3. 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 4. Prominent os trigonum. Electronically Signed   By: Jacqulynn Cadet M.D.   On: 08/13/2021 12:25   ?Right Ankle X-Ray: no noted fracture. ? ?Procedures ?Procedures (including critical care time) ? ?Medications Ordered in UC ?Medications - No data to display ? ?Initial Impression / Assessment and Plan / UC Course  ?I have reviewed the triage vital signs and the nursing notes. ? ?Pertinent labs & imaging results that were available during my care of the patient were reviewed by me and considered in my medical decision making (see chart for details). ? ?Clinical Course as of 08/13/21 1310  ?Tue Aug 13, 2021  ?1222 DG Ankle Complete Right [DJ]  ?  ?Clinical Course User Index ?[DJ] Nyoka Lint, PA-C  ? ?Plan: ?Advised patient to take ibuprofen 800 mg 1 every 8 hours with food for the next 5 to 7 days. ?Ankle support provided to the patient to wear for the next couple days so he will be more comfortable walking. ?Advised patient to elevate and put ice to the area 10 minutes on 20 minutes off 3-4 times a day over the next couple days. ?Patient advised to follow-up with PCP or to return if symptoms fail to  improve over the next week to 10 days. ? ? ?Final Clinical Impressions(s) / UC Diagnoses  ? ?Final diagnoses:  ?Acute right ankle pain  ?Ankle strain, right, initial encounter  ?Right ankle swelling  ? ? ? ?Discharge Instructions   ? ?  ?Advised elevation, ice frequently to area: 10 minutes on 20 minutes off  3-4 times a day over the next 2 to 3 days. ?Ibuprofen as needed for pain. ?Follow-up with PCP or urgent care if symptoms fail to improve in the next 3 to 4 weeks. ? ? ? ?ED Prescriptions   ? ? Medication Sig Dispense Auth. Provider  ? ibuprofen (ADVIL) 800 MG tablet Take 1 tablet (800 mg total) by mouth 3 (three) times daily. 21 tablet Nyoka Lint, PA-C  ? ?  ? ?PDMP not reviewed this encounter. ?  ?Nyoka Lint, PA-C ?08/13/21 1310 ? ?

## 2021-09-08 ENCOUNTER — Encounter: Payer: Self-pay | Admitting: Infectious Diseases

## 2022-01-18 IMAGING — CT CT ABD-PELV W/O CM
2 of 4 series · 15 of 46 positions shown, 17 images · non-contrast
Comparison: 12/21/2019

CLINICAL DATA: Re-evaluate liver abscess, status post drain
placement

EXAM:
CT ABDOMEN AND PELVIS WITHOUT CONTRAST
TECHNIQUE: Multidetector CT imaging of the abdomen and pelvis was performed
following the standard protocol without IV contrast.

[Series 2: axial st · axial · 0.88mm/px · z∈[-577,-67]mm · 12 of 116 slices shown, 14 images]
[im 7/116  soft-tissue]
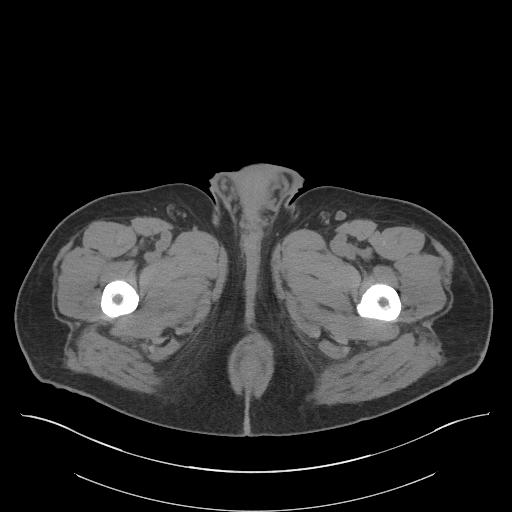
[im 7/116  bone]
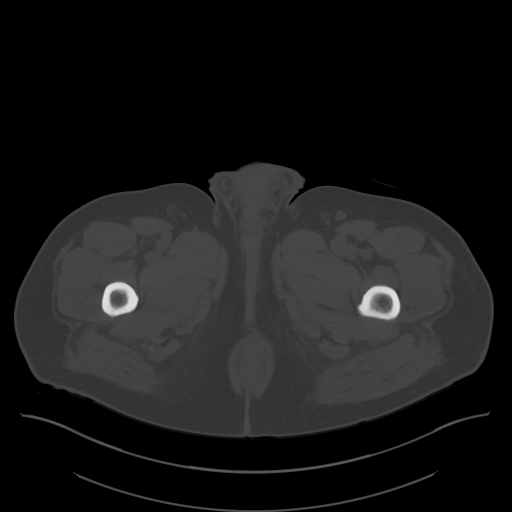
[im 21/116  soft-tissue]
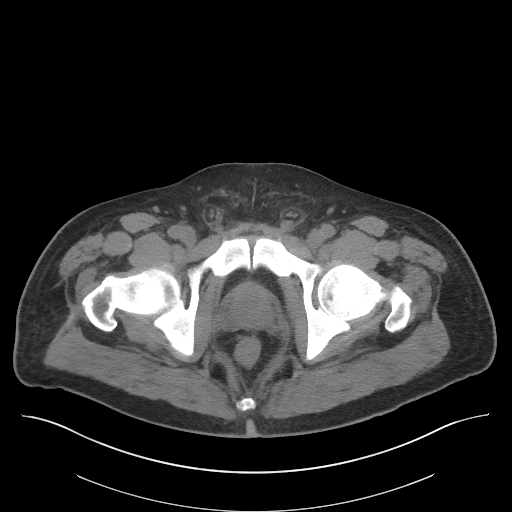
[im 28/116  soft-tissue]
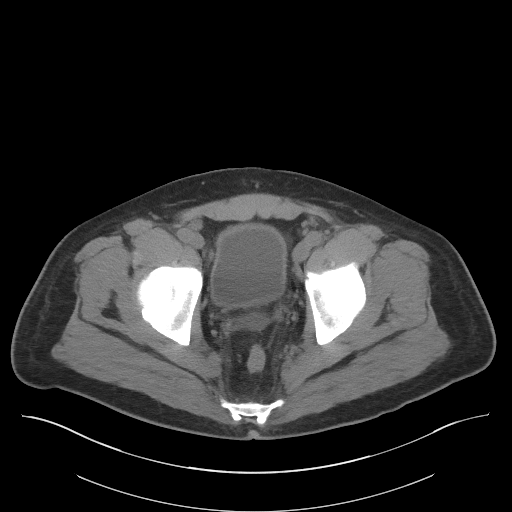
[im 34/116  soft-tissue]
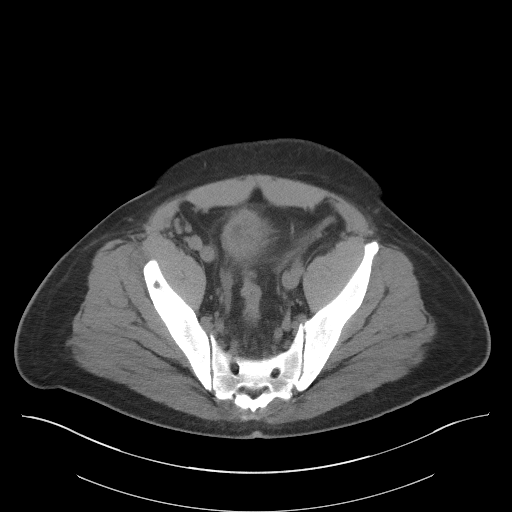
[im 48/116  soft-tissue]
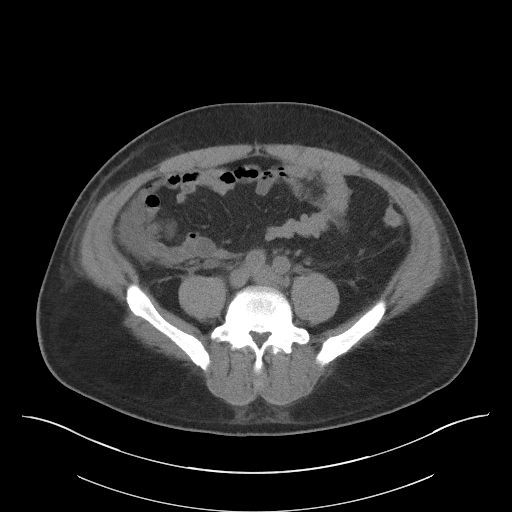
[im 55/116  soft-tissue]
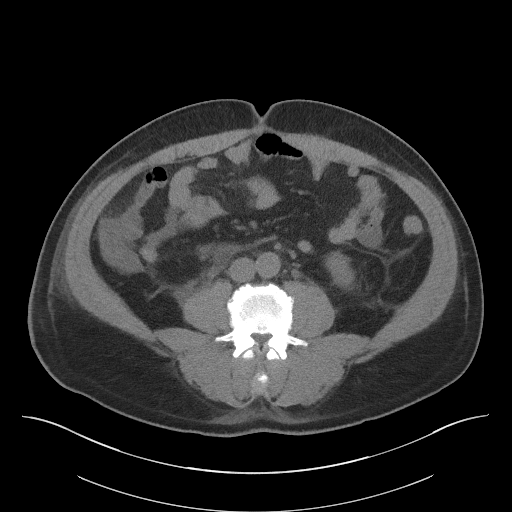
[im 61/116  soft-tissue]
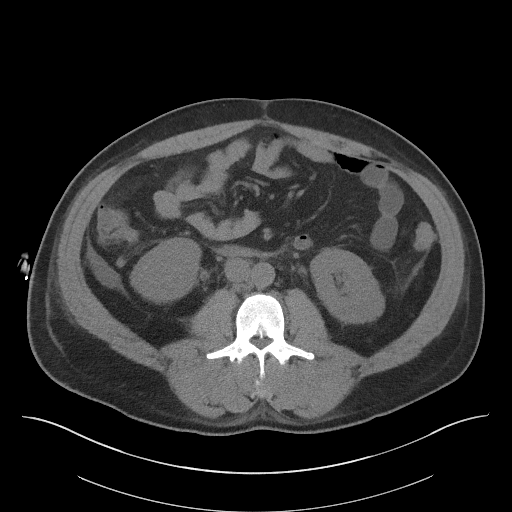
[im 75/116  soft-tissue]
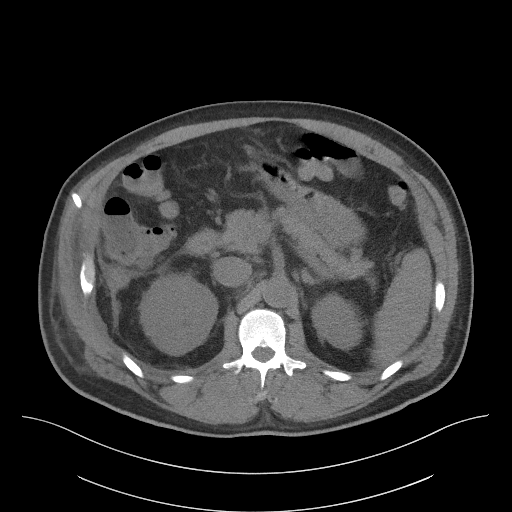
[im 82/116  soft-tissue]
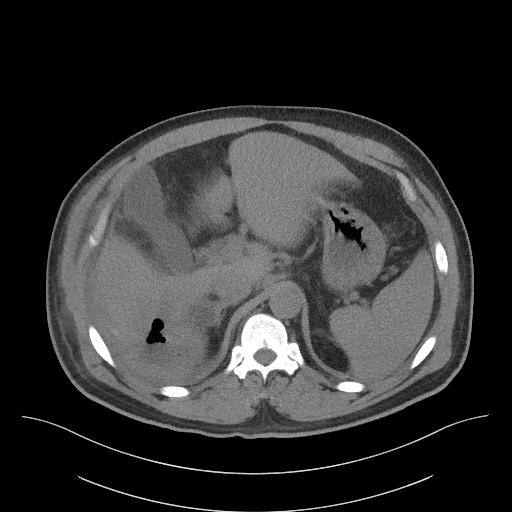
[im 82/116  bone]
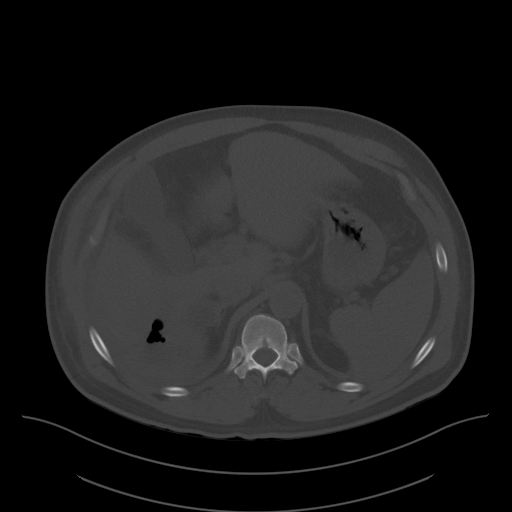
[im 88/116  soft-tissue]
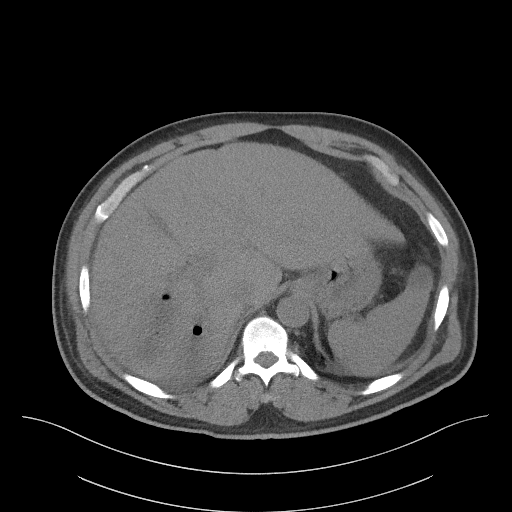
[im 102/116  soft-tissue]
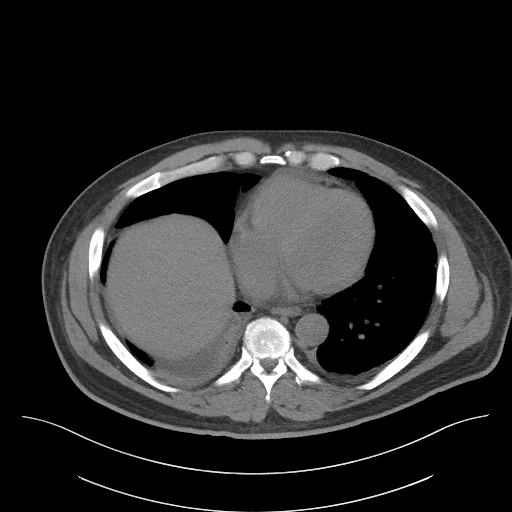
[im 109/116  soft-tissue]
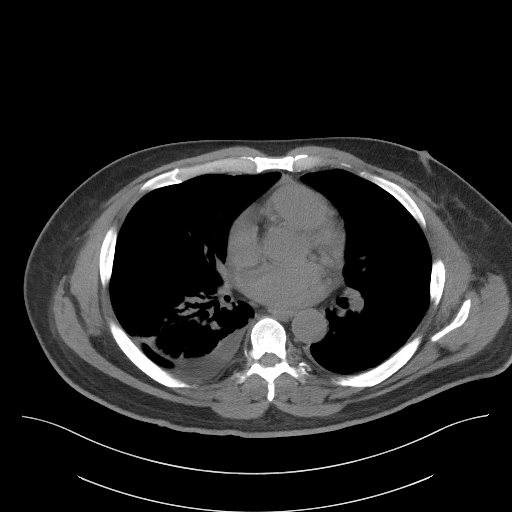

[Series 4: coronal st · coronal · 0.98mm/px · 3 of 116 slices shown]
[im 39/116  soft-tissue]
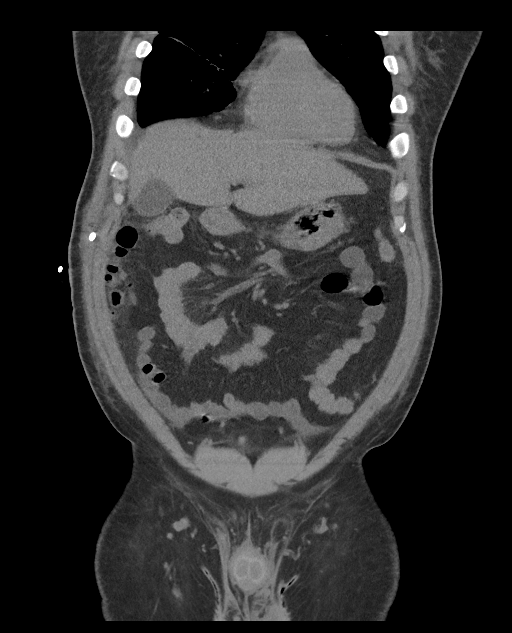
[im 52/116  soft-tissue]
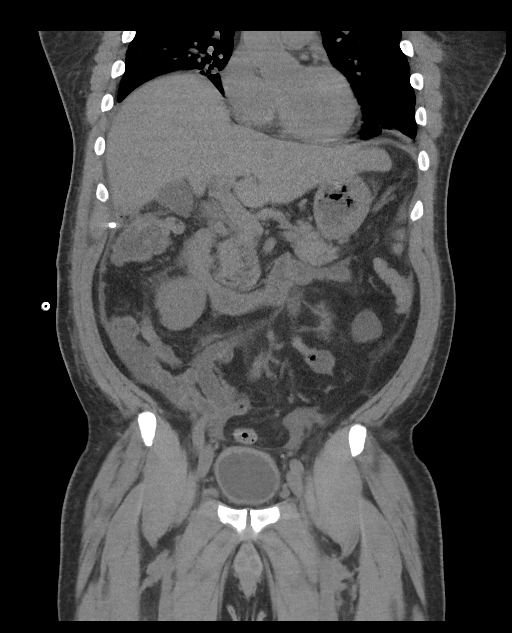
[im 64/116  soft-tissue]
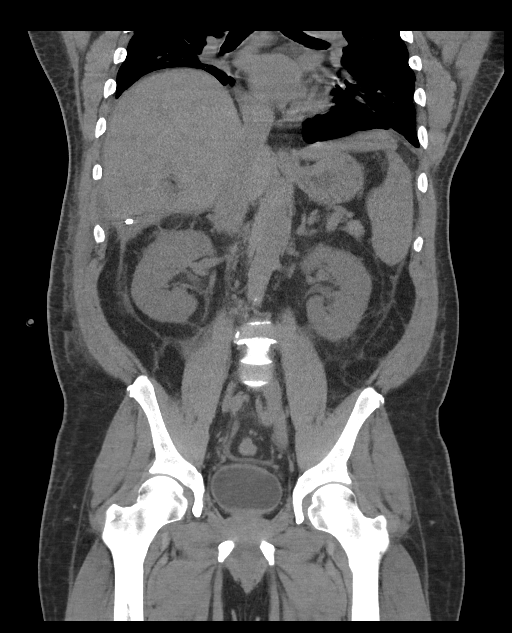

[15 of 46 positions shown; findings below may reference images not displayed]

FINDINGS: Lower chest: Small right pleural effusion associated atelectasis or
consolidation, slightly increased compared to prior examination.

Hepatobiliary: Significant interval decrease in size of a large
fluid collection centered in the right lobe of the liver and
adjacent retroperitoneum/hepatorenal fossa, with interval placement
of a percutaneous pigtail drain catheter. The largest component is
almost completely resolved, however there are persistent air and
fluid containing components of this collection within the posterior
right lobe of the liver, hepatic hepatic segment VI, which are not
significantly changed, measuring 6.5 x 2.5 cm and 4.9 x 1.6 cm
(series 2, image 31). No gallstones, gallbladder wall thickening, or
biliary dilatation.

Pancreas: Unremarkable. No pancreatic ductal dilatation or
surrounding inflammatory changes.

Spleen: There are new, subtle hypodense lesions of the spleen,
measuring 2.1 x 1.9 cm posteriorly (series 2, image 38) and 2.2 x
2.2 cm anteriorly (series 2, image 30).

Adrenals/Urinary Tract: Adrenal glands are unremarkable. Kidneys are
normal, without renal calculi, solid lesion, or hydronephrosis.
Bladder is unremarkable.

Stomach/Bowel: Stomach is within normal limits. Proximal appendix is
normal in appearance and the tip of the elongated, retrocecal
appendix appears to communicate with the abscess collection, with a
calculus contained within (series 2, image 37). No evidence of
bowel wall thickening, distention, or inflammatory changes.

Vascular/Lymphatic: No significant vascular findings are present.
Unchanged prominent portacaval and retroperitoneal lymph nodes,
likely reactive.

Reproductive: No mass or other significant abnormality.

Other: No abdominal wall hernia or abnormality.  Trace ascites.

Musculoskeletal: No acute or significant osseous findings.
IMPRESSION: 1. Significant interval decrease in size of a large fluid collection
centered in the right lobe of the liver and adjacent
retroperitoneum/hepatorenal fossa, with interval placement of a
percutaneous pigtail drain catheter. The largest component is almost
completely resolved, however there are persistent air and fluid
containing components of this collection within the posterior right
lobe of the liver, hepatic hepatic hepatic segment VI, which are not
significantly changed, measuring 6.5 x 2.5 cm and 4.9 x 1.6 cm.
2. Findings, configuration of the appendix, and the presence of a
calculus within this collection again suggest complication of a
perforated tip appendicitis.
3. There are new, subtle hypodense lesions of the spleen, measuring
2.1 x 1.9 cm posteriorly and 2.2 x 2.2 cm anteriorly. These were
specifically not seen on prior contrast enhanced CT examination and
are concerning for hematogenous phlegmon or abscess formation.
4. Small right pleural effusion associated atelectasis or
consolidation, slightly increased compared to prior examination.
5. Trace ascites.

## 2022-03-26 ENCOUNTER — Ambulatory Visit (HOSPITAL_COMMUNITY)
Admission: EM | Admit: 2022-03-26 | Discharge: 2022-03-26 | Disposition: A | Discharge status: Home / Self Care | Payer: 59 | Attending: Physician Assistant | Admitting: Physician Assistant

## 2022-03-26 ENCOUNTER — Encounter (HOSPITAL_COMMUNITY): Payer: Self-pay

## 2022-03-26 DIAGNOSIS — E1165 Type 2 diabetes mellitus with hyperglycemia: Secondary | ICD-10-CM

## 2022-03-26 DIAGNOSIS — M109 Gout, unspecified: Secondary | ICD-10-CM

## 2022-03-26 LAB — CBG MONITORING, ED: Glucose-Capillary: 206 mg/dL — ABNORMAL HIGH (ref 70–99)

## 2022-03-26 MED ORDER — COLCHICINE 0.6 MG PO TABS: ORAL_TABLET | ORAL | 0 refills | Status: DC

## 2022-03-26 NOTE — ED Triage Notes (Signed)
Left foot pain causing swelling and difficult to apply pressure or walk on it x 1day  ibuprofen at taken at 12:30pm today

## 2022-03-26 NOTE — Discharge Instructions (Addendum)
You have diabetes.  I have put in for you to follow-up with primary care.  Someone to call you to schedule an appointment.  If you do not get this addressed you can end up with lots of complications that can be life-threatening.  If you are interested you can return here and we will consider ordering the labs.  Otherwise you need to follow-up with primary care provider soon as possible.  If you develop any shortness of breath, nausea, vomiting, weakness you need to go to the emergency room.  I am concerned you have gout.  Take colchicine as prescribed.  Avoid NSAIDs including aspirin, ibuprofen/Advil, naproxen/Aleve.  If anything worsens or changes please return for reevaluation.

## 2022-03-26 NOTE — ED Provider Notes (Signed)
[facility]    CSN: [id number] Arrival date & time: 03/26/22  1642      History   Chief Complaint Chief Complaint  Patient presents with   Foot Pain    HPI Dillon Wolfe is a 51 y.o. male.   Patient presents today with a 1 day history of left ankle swelling and pain.  Reports pain is rated 4 on a 0-10 pain scale, described as aching, worse with ambulation, improved with ibuprofen.  Reports that he took 800 mg of ibuprofen at approximately 1230 which provided some relief of symptoms.  He denies history of gout but does have recurrent episodes of similar symptoms in the past.  There is no uric acid level on file.  He denies any significant past medical history despite having type 2 diabetes and chronic kidney disease listed in his file.  Reports that he has not been officially diagnosed with this and it was situational related to an illness.  He does report increased alcohol consumption and dietary indiscretion as he was hanging out with friends over the weekend.  Denies any recent medication changes and does not take thiazide diuretic.  He denies any known injury or change in activity prior to symptom onset.  He is able to ambulate without assistance.    History reviewed. No pertinent past medical history.  Patient Active Problem List   Diagnosis Date Noted   Type 2 diabetes mellitus with other specified complication (HCC) 01/17/2020   CKD stage 3 due to type 2 diabetes mellitus (HCC) 01/17/2020   Liver abscess 12/21/2019    Past Surgical History:  Procedure Laterality Date   IR RADIOLOGIST EVAL & MGMT  01/17/2020       Home Medications    Prior to Admission medications   Medication Sig Start Date End Date Taking? Authorizing Provider  colchicine 0.6 MG tablet Take 2 tablets (1.2 mg) day 1 then take 1 tablet (0.6 mg) days 2 through 5 as needed. 03/26/22  Yes Tayshun Gappa, Noberto Retort, PA-C    Family History Family History  Problem Relation Age of Onset   Obesity  Mother    Heart failure Mother    Hypertension Mother    Cancer Father     Social History Social History   Tobacco Use   Smoking status: Light Smoker    Types: Cigars   Smokeless tobacco: Never   Tobacco comments:    black and milds when drinking  Vaping Use   Vaping Use: Never used  Substance Use Topics   Alcohol use: Not Currently    Comment: Socially   Drug use: No     Allergies   Ivp dye [iodinated contrast media]   Review of Systems Review of Systems  Constitutional:  Negative for activity change, appetite change, fatigue and fever.  Gastrointestinal:  Negative for abdominal pain, diarrhea, nausea and vomiting.  Musculoskeletal:  Positive for arthralgias, gait problem and joint swelling. Negative for myalgias.  Neurological:  Negative for weakness and numbness.     Physical Exam Triage Vital Signs ED Triage Vitals  Enc Vitals Group     BP 03/26/22 1938 124/87     Pulse Rate 03/26/22 1938 82     Resp 03/26/22 1938 12     Temp 03/26/22 1938 98.4 F (36.9 C)     Temp Source 03/26/22 1938 Oral     SpO2 03/26/22 1938 98 %     Weight --      Height --  Head Circumference --      Peak Flow --      Pain Score 03/26/22 1942 8     Pain Loc --      Pain Edu? --      Excl. in GC? --    No data found.  Updated Vital Signs BP 124/87 (BP Location: Left Arm)   Pulse 82   Temp 98.4 F (36.9 C) (Oral)   Resp 12   SpO2 98%   Visual Acuity Right Eye Distance:   Left Eye Distance:   Bilateral Distance:    Right Eye Near:   Left Eye Near:    Bilateral Near:     Physical Exam Vitals reviewed.  Constitutional:      General: He is awake.     Appearance: Normal appearance. He is well-developed. He is not ill-appearing.     Comments: Very pleasant male appears stated age in no acute distress sitting comfortably in exam room  HENT:     Head: Normocephalic and atraumatic.     Mouth/Throat:     Pharynx: No oropharyngeal exudate, posterior oropharyngeal  erythema or uvula swelling.  Cardiovascular:     Rate and Rhythm: Normal rate and regular rhythm.     Heart sounds: Normal heart sounds, S1 normal and S2 normal. No murmur heard.    Comments: Capillary refill within 3 seconds left toes Pulmonary:     Effort: Pulmonary effort is normal.     Breath sounds: Normal breath sounds. No stridor. No wheezing, rhonchi or rales.     Comments: Clear to auscultation bilaterally Musculoskeletal:     Left ankle: Swelling present. No tenderness. No lateral malleolus or medial malleolus tenderness. Normal range of motion.     Left foot: Normal range of motion. No deformity or bunion.     Comments: Swelling and mild tenderness palpation over lateral ankle.  No deformity noted.  Foot neurovascularly intact.  Feet:     Left foot:     Protective Sensation: 10 sites tested.  10 sites sensed.     Skin integrity: No ulcer, blister or skin breakdown.  Neurological:     Mental Status: He is alert.  Psychiatric:        Behavior: Behavior is cooperative.      UC Treatments / Results  Labs (all labs ordered are listed, but only abnormal results are displayed) Labs Reviewed  CBG MONITORING, ED - Abnormal; Notable for the following components:      Result Value   Glucose-Capillary 206 (*)    All other components within normal limits    EKG   Radiology No results found.  Procedures Procedures (including critical care time)  Medications Ordered in UC Medications - No data to display  Initial Impression / Assessment and Plan / UC Course  I have reviewed the triage vital signs and the nursing notes.  Pertinent labs & imaging results that were available during my care of the patient were reviewed by me and considered in my medical decision making (see chart for details).     Patient is well-appearing, afebrile, nontoxic, nontachycardic.  X-ray was deferred as he has no point bony tenderness or recent injury based on [place] ankle rules.  Concern for  gout given recent dietary indiscretion and increased alcohol consumption.  Initially thought about treating with prednisone as he has been taking ibuprofen with only minimal improvement of symptoms, however, patient had diabetes listed in his chart though he denied history of this condition.  Random blood sugar was obtained and showed that his blood sugars elevated 206 consistent with type 2 diabetes.  Discussed that we should obtain basic labs including CBC, CMP, uric acid, A1c in order to dose the medication and determine best additional treatment plan.  Patient declined this and despite trying to consume several times to consider initiating medication and obtaining labs he continued to decline.  He is open to establishing with a primary care so we will try to establish him with someone via PCP assistance.  We did discuss that if he does not receive treatment this can lead to lifelong complications to which he expressed understanding.  Will treat with colchicine in the meantime.  Discussed that he should avoid carbohydrates and drink plenty of fluid.  He is to avoid NSAIDs but can use Tylenol for additional pain relief.  Discussed that if he has any worsening or changing symptoms including increasing pain, increasing swelling, numbness, nausea, vomiting, shortness of breath he needs to be seen immediately.  Strict return precautions given.  Work excuse note provided.  Final Clinical Impressions(s) / UC Diagnoses   Final diagnoses:  Type 2 diabetes mellitus with hyperglycemia, without long-term current use of insulin (HCC)  Acute gout of left ankle, unspecified cause     Discharge Instructions      You have diabetes.  I have put in for you to follow-up with primary care.  Someone to call you to schedule an appointment.  If you do not get this addressed you can end up with lots of complications that can be life-threatening.  If you are interested you can return here and we will consider ordering the  labs.  Otherwise you need to follow-up with primary care provider soon as possible.  If you develop any shortness of breath, nausea, vomiting, weakness you need to go to the emergency room.  I am concerned you have gout.  Take colchicine as prescribed.  Avoid NSAIDs including aspirin, ibuprofen/Advil, naproxen/Aleve.  If anything worsens or changes please return for reevaluation.     ED Prescriptions     Medication Sig Dispense Auth. Provider   colchicine 0.6 MG tablet Take 2 tablets (1.2 mg) day 1 then take 1 tablet (0.6 mg) days 2 through 5 as needed. 6 tablet Lasalle Abee, Derry Skill, PA-C      PDMP not reviewed this encounter.   Terrilee Croak, PA-C 03/26/22 2047

## 2022-04-10 ENCOUNTER — Ambulatory Visit (HOSPITAL_COMMUNITY)
Admission: EM | Admit: 2022-04-10 | Discharge: 2022-04-10 | Disposition: A | Discharge status: Home / Self Care | Payer: 59 | Attending: Emergency Medicine | Admitting: Emergency Medicine

## 2022-04-10 ENCOUNTER — Encounter (HOSPITAL_COMMUNITY): Payer: Self-pay | Admitting: Emergency Medicine

## 2022-04-10 ENCOUNTER — Other Ambulatory Visit: Payer: Self-pay

## 2022-04-10 DIAGNOSIS — M109 Gout, unspecified: Secondary | ICD-10-CM | POA: Diagnosis not present

## 2022-04-10 HISTORY — DX: Gout, unspecified: M10.9

## 2022-04-10 MED ORDER — COLCHICINE 0.6 MG PO TABS: ORAL_TABLET | ORAL | 0 refills | Status: DC

## 2022-04-10 NOTE — ED Triage Notes (Addendum)
Patient was seen last week 03/26/2022 for the same.  Patient reports left lower leg is swollen again.  Patient reports leg did improve after 12/20 appt.  Patient has had items in diet like seafood, egg nog/alcohol after being seen at last visit.  Patient has made arrangements for a primary doctor.  Patient tried to get a refill on medicine given at 12/20 visit and realized medicine did not have refills  Has taken ibuprofen x 4

## 2022-04-10 NOTE — ED Provider Notes (Signed)
[place]    CSN: [id number] Arrival date & time: 04/10/22  1341      History   Chief Complaint No chief complaint on file.   HPI Dillon Wolfe is a 52 y.o. male.  Patient presents complaining of a gout flare of his left ankle that started this past week.  Patient states that he was seen on 03/26/2022, diagnosed with gout, and provided with colchicine which provided relief of his symptoms.  Patient reports that he mistakenly drank some alcohol and ate seafood which may have flared his gout back up.  He reports that he is establishing care with a PCP but his appointment is not until later this month.  He denies any fall or trauma.  He denies any numbness or tingling in his left lower extremity.   HPI  Past Medical History:  Diagnosis Date   Gout     Patient Active Problem List   Diagnosis Date Noted   Type 2 diabetes mellitus with other specified complication ([place]) 10/01/9483   CKD stage 3 due to type 2 diabetes mellitus ([place]) 01/17/2020   Liver abscess 12/21/2019    Past Surgical History:  Procedure Laterality Date   IR RADIOLOGIST EVAL & MGMT  01/17/2020       Home Medications    Prior to Admission medications   Medication Sig Start Date End Date Taking? Authorizing Provider  colchicine 0.6 MG tablet Take 2 tablets (1.2 mg) day 1 then take 1 tablet (0.6 mg) days 2 through 5 as needed. 04/10/22   Flossie Dibble, NP    Family History Family History  Problem Relation Age of Onset   Obesity Mother    Heart failure Mother    Hypertension Mother    Cancer Father     Social History Social History   Tobacco Use   Smoking status: Light Smoker    Types: Cigars   Smokeless tobacco: Never   Tobacco comments:    black and milds when drinking  Vaping Use   Vaping Use: Never used  Substance Use Topics   Alcohol use: Not Currently    Comment: Socially   Drug use: No     Allergies   Ivp dye [iodinated contrast media]   Review of  Systems Review of Systems Per HPI   Physical Exam Triage Vital Signs ED Triage Vitals  Enc Vitals Group     BP 04/10/22 1700 126/85     Pulse Rate 04/10/22 1700 82     Resp 04/10/22 1700 18     Temp 04/10/22 1700 98.4 F (36.9 C)     Temp Source 04/10/22 1700 Oral     SpO2 04/10/22 1700 96 %     Weight --      Height --      Head Circumference --      Peak Flow --      Pain Score 04/10/22 1658 6     Pain Loc --      Pain Edu? --      Excl. in [place]? --    No data found.  Updated Vital Signs BP 126/85 (BP Location: Right Arm) Comment: large cuff  Pulse 82   Temp 98.4 F (36.9 C) (Oral)   Resp 18   SpO2 96%      Physical Exam Vitals and nursing note reviewed.  Constitutional:      Appearance: Normal appearance.  Cardiovascular:     Pulses:  Dorsalis pedis pulses are 2+ on the left side.  Musculoskeletal:     Right lower leg: No edema.     Left lower leg: No edema.     Left ankle: Swelling (around medial, lateral, and  anterior) present. No ecchymosis. No tenderness. No lateral malleolus or medial malleolus tenderness. Normal range of motion. Normal pulse.     Left Achilles Tendon: Normal.     Comments: Warm to touch. No bony tenderness.  No rash or bruising present.   Neurological:     Mental Status: He is alert.      UC Treatments / Results  Labs (all labs ordered are listed, but only abnormal results are displayed) Labs Reviewed - No data to display  EKG   Radiology No results found.  Procedures Procedures (including critical care time)  Medications Ordered in UC Medications - No data to display  Initial Impression / Assessment and Plan / UC Course  I have reviewed the triage vital signs and the nursing notes.  Pertinent labs & imaging results that were available during my care of the patient were reviewed by me and considered in my medical decision making (see chart for details).     Patient was evaluated for gout of left ankle.   Colchicine prescription sent to pharmacy.  Patient was made aware of prevention and dietary changes.  Patient was made aware that he will need to follow-up with PCP and keep his scheduled appointment.  Patient verbalized understanding of instructions.  Charting was provided using a a verbal dictation system, charting was proofread for errors, errors may occur which could change the meaning of the information charted.   Final Clinical Impressions(s) / UC Diagnoses   Final diagnoses:  Acute gout of left ankle, unspecified cause     Discharge Instructions      Colchicine has been sent to the pharmacy, you will take 2 tablets today and 1 tablet from days 2-5 as needed.   Please keep the extremity elevated at home.   Please make sure to follow-up with your PCP and keep the scheduled appointment.      ED Prescriptions     Medication Sig Dispense Auth. Provider   colchicine 0.6 MG tablet Take 2 tablets (1.2 mg) day 1 then take 1 tablet (0.6 mg) days 2 through 5 as needed. 6 tablet Flossie Dibble, NP      PDMP not reviewed this encounter.   Flossie Dibble, NP 04/11/22 [phone]

## 2022-04-10 NOTE — Discharge Instructions (Signed)
Colchicine has been sent to the pharmacy, you will take 2 tablets today and 1 tablet from days 2-5 as needed.   Please keep the extremity elevated at home.   Please make sure to follow-up with your PCP and keep the scheduled appointment.

## 2022-08-07 ENCOUNTER — Ambulatory Visit (HOSPITAL_COMMUNITY)
Admission: EM | Admit: 2022-08-07 | Discharge: 2022-08-07 | Disposition: A | Discharge status: Home / Self Care | Payer: 59 | Attending: Internal Medicine | Admitting: Internal Medicine

## 2022-08-07 ENCOUNTER — Encounter (HOSPITAL_COMMUNITY): Payer: Self-pay | Admitting: Emergency Medicine

## 2022-08-07 DIAGNOSIS — M25531 Pain in right wrist: Secondary | ICD-10-CM | POA: Diagnosis not present

## 2022-08-07 DIAGNOSIS — M10031 Idiopathic gout, right wrist: Secondary | ICD-10-CM | POA: Diagnosis not present

## 2022-08-07 MED ORDER — PREDNISONE 10 MG (21) PO TBPK: ORAL_TABLET | Freq: Every day | ORAL | 0 refills | Status: DC

## 2022-08-07 NOTE — Discharge Instructions (Addendum)
Apply a compressive ACE bandage. Rest and elevate the affected painful area.  Apply cold compresses intermittently as needed.  Please take medications as directed.  As pain recedes, begin normal activities slowly as tolerated.  Please return to urgent care if symptoms worsen.

## 2022-08-07 NOTE — ED Triage Notes (Signed)
Pt c/o right hand swelling for about 2 weeks. Thought was either gout or arthritis and tried elevating and treating with OTC medications that aren't helping. Denies injury

## 2022-08-07 NOTE — ED Provider Notes (Signed)
[facility]    CSN: [id number] Arrival date & time: 08/07/22  1520      History   Chief Complaint Chief Complaint  Patient presents with   Hand Pain    HPI Dillon Wolfe is a 52 y.o. male with a history of gout comes to the urgent care with 1 week history of right hand pain.  Over the past week the patient has pain elevating and icing right hand.  He has not seen any improvement in the level of pain and swelling.  Pain is of moderate severity, throbbing with no known relieving factors.  No erythema. His hand is swollen.  Patient denies any trauma to the hand.Marland Kitchen   HPI  Past Medical History:  Diagnosis Date   Gout     Patient Active Problem List   Diagnosis Date Noted   Type 2 diabetes mellitus with other specified complication (HCC) 01/17/2020   CKD stage 3 due to type 2 diabetes mellitus (HCC) 01/17/2020   Liver abscess 12/21/2019    Past Surgical History:  Procedure Laterality Date   IR RADIOLOGIST EVAL & MGMT  01/17/2020       Home Medications    Prior to Admission medications   Medication Sig Start Date End Date Taking? Authorizing Provider  predniSONE (STERAPRED UNI-PAK 21 TAB) 10 MG (21) TBPK tablet Take by mouth daily. Take 6 tabs by mouth daily  for 2 days, then 5 tabs for 2 days, then 4 tabs for 2 days, then 3 tabs for 2 days, 2 tabs for 2 days, then 1 tab by mouth daily for 2 days 08/07/22  Yes Riker Collier, Britta Mccreedy, MD    Family History Family History  Problem Relation Age of Onset   Obesity Mother    Heart failure Mother    Hypertension Mother    Cancer Father     Social History Social History   Tobacco Use   Smoking status: Light Smoker    Types: Cigars   Smokeless tobacco: Never   Tobacco comments:    black and milds when drinking  Vaping Use   Vaping Use: Never used  Substance Use Topics   Alcohol use: Not Currently    Comment: Socially   Drug use: No     Allergies   Ivp dye [iodinated contrast media]   Review of  Systems Review of Systems As per HPI  Physical Exam Triage Vital Signs ED Triage Vitals  Enc Vitals Group     BP 08/07/22 1533 117/81     Pulse Rate 08/07/22 1533 95     Resp 08/07/22 1533 17     Temp 08/07/22 1533 98.2 F (36.8 C)     Temp Source 08/07/22 1533 Oral     SpO2 08/07/22 1533 95 %     Weight --      Height --      Head Circumference --      Peak Flow --      Pain Score 08/07/22 1531 4     Pain Loc --      Pain Edu? --      Excl. in [place]? --    No data found.  Updated Vital Signs BP 117/81 (BP Location: Left Arm)   Pulse 95   Temp 98.2 F (36.8 C) (Oral)   Resp 17   SpO2 95%   Visual Acuity Right Eye Distance:   Left Eye Distance:   Bilateral Distance:    Right Eye Near:  Left Eye Near:    Bilateral Near:     Physical Exam Vitals and nursing note reviewed.  Constitutional:      Appearance: Normal appearance.  Cardiovascular:     Rate and Rhythm: Normal rate and regular rhythm.     Pulses: Normal pulses.     Heart sounds: Normal heart sounds.  Musculoskeletal:     Comments: Tender swelling of the right hand.  Swelling is more prominent on the dorsum of the right hand.  Patient is able to flex and extend fingers.  No erythema noted.  Neurological:     Mental Status: He is alert.      UC Treatments / Results  Labs (all labs ordered are listed, but only abnormal results are displayed) Labs Reviewed - No data to display  EKG   Radiology No results found.  Procedures Procedures (including critical care time)  Medications Ordered in UC Medications - No data to display  Initial Impression / Assessment and Plan / UC Course  I have reviewed the triage vital signs and the nursing notes.  Pertinent labs & imaging results that were available during my care of the patient were reviewed by me and considered in my medical decision making (see chart for details).     1.  Acute acute gout flare: Tapering dose of prednisone Rest, elevate,  cool compress and compliance with medication recommended Return precautions given. Final Clinical Impressions(s) / UC Diagnoses   Final diagnoses:  Right wrist pain  Acute idiopathic gout of right wrist     Discharge Instructions      Apply a compressive ACE bandage. Rest and elevate the affected painful area.  Apply cold compresses intermittently as needed.  Please take medications as directed.  As pain recedes, begin normal activities slowly as tolerated.  Please return to urgent care if symptoms worsen.     ED Prescriptions     Medication Sig Dispense Auth. Provider   predniSONE (STERAPRED UNI-PAK 21 TAB) 10 MG (21) TBPK tablet Take by mouth daily. Take 6 tabs by mouth daily  for 2 days, then 5 tabs for 2 days, then 4 tabs for 2 days, then 3 tabs for 2 days, 2 tabs for 2 days, then 1 tab by mouth daily for 2 days 42 tablet Lowanda Cashaw, Britta Mccreedy, MD      PDMP not reviewed this encounter.   Merrilee Jansky, MD 08/07/22 2252

## 2023-01-10 ENCOUNTER — Emergency Department (HOSPITAL_COMMUNITY)
Admission: EM | Admit: 2023-01-10 | Discharge: 2023-01-10 | Disposition: A | Discharge status: Home / Self Care | Payer: BLUE CROSS/BLUE SHIELD | Attending: Emergency Medicine | Admitting: Emergency Medicine

## 2023-01-10 ENCOUNTER — Other Ambulatory Visit: Payer: Self-pay

## 2023-01-10 ENCOUNTER — Encounter (HOSPITAL_COMMUNITY): Payer: Self-pay

## 2023-01-10 DIAGNOSIS — M25562 Pain in left knee: Secondary | ICD-10-CM | POA: Diagnosis not present

## 2023-01-10 DIAGNOSIS — M25571 Pain in right ankle and joints of right foot: Secondary | ICD-10-CM | POA: Diagnosis not present

## 2023-01-10 DIAGNOSIS — N189 Chronic kidney disease, unspecified: Secondary | ICD-10-CM | POA: Insufficient documentation

## 2023-01-10 DIAGNOSIS — E119 Type 2 diabetes mellitus without complications: Secondary | ICD-10-CM | POA: Diagnosis not present

## 2023-01-10 DIAGNOSIS — Z0279 Encounter for issue of other medical certificate: Secondary | ICD-10-CM | POA: Insufficient documentation

## 2023-01-10 DIAGNOSIS — M79671 Pain in right foot: Secondary | ICD-10-CM | POA: Insufficient documentation

## 2023-01-10 MED ORDER — COLCHICINE-PROBENECID 0.5-500 MG PO TABS: 1.0000 | ORAL_TABLET | Freq: Every day | ORAL | 0 refills | Status: DC

## 2023-01-10 MED ORDER — PREDNISONE 10 MG (21) PO TBPK: ORAL_TABLET | Freq: Every day | ORAL | 0 refills | Status: DC

## 2023-01-10 NOTE — ED Provider Notes (Signed)
[facility] AT [facility] Provider Note   CSN: [id number] Arrival date & time: 01/10/23  0901     History  Chief Complaint  Patient presents with   Ankle Pain    Dillon Wolfe is a 52 y.o. male.  The history is provided by the patient and medical records. No language interpreter was used.  Ankle Pain    52 year old male significant history of gout, diabetes, CKD presenting with complaint of ankle pain.  Patient report 3 to 4 days ago he noted some pain to his left knee and left foot.  He was using crutches and taking ibuprofen with some improvement.  For the past 2 days he now noticed pain to his right foot/ankle.  Pain is described as a throbbing aching sensation with associated swelling and increasing pain with weightbearing.  Pain felt somewhat similar to prior gout flare that he had had in the past.  He admits to drinking some beer as well as eating some shrimp several days prior but unsure if that contributed to it.  He denies any specific injury to the site.  No fever or chills no chest pain or shortness of breath.  He also requesting for work note.  Patient states he does not have any history of diabetes.  Home Medications Prior to Admission medications   Medication Sig Start Date End Date Taking? Authorizing Provider  predniSONE (STERAPRED UNI-PAK 21 TAB) 10 MG (21) TBPK tablet Take by mouth daily. Take 6 tabs by mouth daily  for 2 days, then 5 tabs for 2 days, then 4 tabs for 2 days, then 3 tabs for 2 days, 2 tabs for 2 days, then 1 tab by mouth daily for 2 days 08/07/22   Merrilee Jansky, MD      Allergies    Ivp dye [iodinated contrast media]    Review of Systems   Review of Systems  All other systems reviewed and are negative.   Physical Exam Updated Vital Signs BP (!) 157/97   Pulse 100   Temp (!) 97.2 F (36.2 C) (Oral)   Resp 14   Ht 5\' 8"  (1.727 m)   Wt 120.2 kg   SpO2 95%   BMI 40.29 kg/m  Physical Exam Vitals and  nursing note reviewed.  Constitutional:      General: He is not in acute distress.    Appearance: He is well-developed.  HENT:     Head: Atraumatic.  Eyes:     Conjunctiva/sclera: Conjunctivae normal.  Musculoskeletal:        General: Tenderness (Right foot: Tenderness noted to midfoot with some mild swelling noted but no erythema or warmth.  First metatarsal a bit swollen as well.  Intact status pedis pulse with brisk cap refill.) present.     Cervical back: Neck supple.     Comments: Right ankle with normal range of motion and nontender.  Mild tenderness about left knee and left foot without significant edema erythema or warmth.  Skin:    Findings: No rash.  Neurological:     Mental Status: He is alert.     ED Results / Procedures / Treatments   Labs (all labs ordered are listed, but only abnormal results are displayed) Labs Reviewed - No data to display  EKG None  Radiology No results found.  Procedures Procedures    Medications Ordered in ED Medications - No data to display  ED Course/ Medical Decision Making/ A&P  Medical Decision Making  BP (!) 157/97   Pulse 100   Temp (!) 97.2 F (36.2 C) (Oral)   Resp 14   Ht 5\' 8"  (1.727 m)   Wt 120.2 kg   SpO2 95%   BMI 40.29 kg/m   73:31 AM  52 year old male significant history of gout, diabetes, CKD presenting with complaint of ankle pain.  Patient report 3 to 4 days ago he noted some pain to his left knee and left foot.  He was using crutches and taking ibuprofen with some improvement.  For the past 2 days he now noticed pain to his right foot/ankle.  Pain is described as a throbbing aching sensation with associated swelling and increasing pain with weightbearing.  Pain felt somewhat similar to prior gout flare that he had had in the past.  He admits to drinking some beer as well as eating some shrimp several days prior but unsure if that contributed to it.  He denies any specific  injury to the site.  No fever or chills no chest pain or shortness of breath.  He also requesting for work note.  Patient states he does not have any history of diabetes.  On exam, well-appearing male resting comfortably in bed appears to be in no acute discomfort.  Exam remarkable for tenderness to the right midfoot with mild swelling but no evidence of cellulitis.  No recent injury doubt fracture or dislocation.  Low suspicion for DVT or septic joint.  With history of gout in the past, I felt this is likely a gout flare.  Will provide appropriate treatment with return precaution.  Work note provided per request.  Advanced imaging including CT scan of the foot or MRI of the foot was considered but not performed as it is likely low yield and would not likely to change management.  Patient subsequently discharged home with Medrol Dosepak, as well as colchicine and probenecid for symptom control.  Work note provided.  Return precaution given.        Final Clinical Impression(s) / ED Diagnoses Final diagnoses:  Right foot pain    Rx / DC Orders ED Discharge Orders          Ordered    predniSONE (STERAPRED UNI-PAK 21 TAB) 10 MG (21) TBPK tablet  Daily        01/10/23 0956    colchicine-probenecid 0.5-500 MG tablet  Daily        01/10/23 0956              Fayrene Helper, PA-C 01/10/23 0957    Rexford Maus, DO 01/10/23 1110

## 2023-01-10 NOTE — ED Triage Notes (Signed)
Pt c.o pain and swelling in his right ankle since waking up this morning. Reports left ankle pain/swelling since Thursday. Hx of gout Pt ambulatory with cructches

## 2023-03-28 ENCOUNTER — Ambulatory Visit (HOSPITAL_COMMUNITY)
Admission: EM | Admit: 2023-03-28 | Discharge: 2023-03-28 | Disposition: A | Discharge status: Home / Self Care | Payer: BLUE CROSS/BLUE SHIELD | Attending: Internal Medicine | Admitting: Internal Medicine

## 2023-03-28 ENCOUNTER — Encounter (HOSPITAL_COMMUNITY): Payer: Self-pay | Admitting: Emergency Medicine

## 2023-03-28 DIAGNOSIS — M10072 Idiopathic gout, left ankle and foot: Secondary | ICD-10-CM | POA: Diagnosis not present

## 2023-03-28 MED ORDER — PREDNISONE 10 MG (21) PO TBPK: ORAL_TABLET | Freq: Every day | ORAL | 0 refills | Status: DC

## 2023-03-28 MED ORDER — IBUPROFEN 800 MG PO TABS: 800.0000 mg | ORAL_TABLET | Freq: Three times a day (TID) | ORAL | 1 refills | Status: DC | PRN

## 2023-03-28 NOTE — ED Provider Notes (Signed)
[facility]    CSN: [id number] Arrival date & time: 03/28/23  1200      History   Chief Complaint Chief Complaint  Patient presents with   Foot Pain    HPI Dillon Wolfe is a 52 y.o. male.   52 yr old male who presents to urgent care with left ankle and foot pain and swelling.  This started about 3 days ago.  He reports that it is starting to resolve now but he has had trouble at work and had to leave work early secondary to it.  He has had a long history of having gout in multiple locations.  He does not take anything daily for this.  He has not seen a specialist for it.  He has taken colchicine in the past as well as steroids.  He denies fevers or chills. He is able to walk better today and wants to go back to work on Monday.    Foot Pain Pertinent negatives include no chest pain, no abdominal pain and no shortness of breath.    Past Medical History:  Diagnosis Date   Gout     Patient Active Problem List   Diagnosis Date Noted   Type 2 diabetes mellitus with other specified complication (HCC) 01/17/2020   CKD stage 3 due to type 2 diabetes mellitus (HCC) 01/17/2020   Liver abscess 12/21/2019    Past Surgical History:  Procedure Laterality Date   IR RADIOLOGIST EVAL & MGMT  01/17/2020       Home Medications    Prior to Admission medications   Medication Sig Start Date End Date Taking? Authorizing Provider  colchicine-probenecid 0.5-500 MG tablet Take 1 tablet by mouth daily. 01/10/23   Fayrene Helper, PA-C  predniSONE (STERAPRED UNI-PAK 21 TAB) 10 MG (21) TBPK tablet Take by mouth daily. Take 6 tabs by mouth daily  for 2 days, then 5 tabs for 2 days, then 4 tabs for 2 days, then 3 tabs for 2 days, 2 tabs for 2 days, then 1 tab by mouth daily for 2 days 03/28/23   Landis Martins, PA-C    Family History Family History  Problem Relation Age of Onset   Obesity Mother    Heart failure Mother    Hypertension Mother    Cancer Father     Social  History Social History   Tobacco Use   Smoking status: Light Smoker    Types: Cigars   Smokeless tobacco: Never   Tobacco comments:    black and milds when drinking  Vaping Use   Vaping status: Never Used  Substance Use Topics   Alcohol use: Not Currently    Comment: Socially   Drug use: No     Allergies   Ivp dye [iodinated contrast media]   Review of Systems Review of Systems  Constitutional:  Negative for chills and fever.  HENT:  Negative for ear pain and sore throat.   Eyes:  Negative for pain and visual disturbance.  Respiratory:  Negative for cough and shortness of breath.   Cardiovascular:  Negative for chest pain and palpitations.  Gastrointestinal:  Negative for abdominal pain and vomiting.  Genitourinary:  Negative for dysuria and hematuria.  Musculoskeletal:  Positive for joint swelling (left ankle). Negative for arthralgias and back pain.  Skin:  Negative for color change and rash.  Neurological:  Negative for seizures and syncope.  All other systems reviewed and are negative.    Physical Exam Triage Vital Signs ED  Triage Vitals  Encounter Vitals Group     BP 03/28/23 1215 133/85     Systolic BP Percentile --      Diastolic BP Percentile --      Pulse Rate 03/28/23 1215 91     Resp 03/28/23 1215 16     Temp 03/28/23 1215 98.1 F (36.7 C)     Temp Source 03/28/23 1215 Oral     SpO2 03/28/23 1215 94 %     Weight --      Height --      Head Circumference --      Peak Flow --      Pain Score 03/28/23 1212 3     Pain Loc --      Pain Education --      Exclude from Growth Chart --    No data found.  Updated Vital Signs BP 133/85 (BP Location: Right Arm)   Pulse 91   Temp 98.1 F (36.7 C) (Oral)   Resp 16   SpO2 94%   Visual Acuity Right Eye Distance:   Left Eye Distance:   Bilateral Distance:    Right Eye Near:   Left Eye Near:    Bilateral Near:     Physical Exam Vitals and nursing note reviewed.  Constitutional:      General:  He is not in acute distress.    Appearance: He is well-developed.  HENT:     Head: Normocephalic and atraumatic.  Eyes:     Conjunctiva/sclera: Conjunctivae normal.  Cardiovascular:     Rate and Rhythm: Normal rate and regular rhythm.     Heart sounds: No murmur heard. Pulmonary:     Effort: Pulmonary effort is normal. No respiratory distress.     Breath sounds: Normal breath sounds.  Abdominal:     Palpations: Abdomen is soft.     Tenderness: There is no abdominal tenderness.  Musculoskeletal:        General: No swelling.     Cervical back: Neck supple.     Left ankle: Swelling (Mild) present. Tenderness (Very mild laterally) present. Normal range of motion.     Left foot: Normal range of motion. Swelling (Mild) present.  Skin:    General: Skin is warm and dry.     Capillary Refill: Capillary refill takes less than 2 seconds.  Neurological:     Mental Status: He is alert.  Psychiatric:        Mood and Affect: Mood normal.      UC Treatments / Results  Labs (all labs ordered are listed, but only abnormal results are displayed) Labs Reviewed - No data to display  EKG   Radiology No results found.  Procedures Procedures (including critical care time)  Medications Ordered in UC Medications - No data to display  Initial Impression / Assessment and Plan / UC Course  I have reviewed the triage vital signs and the nursing notes.  Pertinent labs & imaging results that were available during my care of the patient were reviewed by me and considered in my medical decision making (see chart for details).     Acute idiopathic gout of left ankle   Acute gout flare of the left ankle and foot. Will treat with the following:  Prednisone taper Take 6 tabs by mouth daily for 2 days, then 5 tabs for 2 days, then 4 tabs for 2 days, then 3 tabs for 2 days, 2 tabs for 2 days, then 1 tab by mouth  daily for 2 days Can consider making a follow up with your PCP to discuss long term  medication for gout. Return to urgent care or PCP if symptoms worsen or fail to resolve.    Final Clinical Impressions(s) / UC Diagnoses   Final diagnoses:  Acute idiopathic gout of left ankle     Discharge Instructions      Acute gout flare of the left ankle and foot. Will treat with the following:  Prednisone taper Take 6 tabs by mouth daily for 2 days, then 5 tabs for 2 days, then 4 tabs for 2 days, then 3 tabs for 2 days, 2 tabs for 2 days, then 1 tab by mouth daily for 2 days Can consider making a follow up with your PCP to discuss long term medication for gout. Return to urgent care or PCP if symptoms worsen or fail to resolve.     ED Prescriptions     Medication Sig Dispense Auth. Provider   predniSONE (STERAPRED UNI-PAK 21 TAB) 10 MG (21) TBPK tablet Take by mouth daily. Take 6 tabs by mouth daily  for 2 days, then 5 tabs for 2 days, then 4 tabs for 2 days, then 3 tabs for 2 days, 2 tabs for 2 days, then 1 tab by mouth daily for 2 days 42 tablet Kinda Pottle, [place] Miles, PA-C      PDMP not reviewed this encounter.   Landis Martins, [place] 03/28/23 1248

## 2023-03-28 NOTE — ED Triage Notes (Signed)
Pt c/o left foot pain for 3 days. States he thinks its gout.  Needs doctors note.

## 2023-03-28 NOTE — Discharge Instructions (Addendum)
Acute gout flare of the left ankle and foot. Will treat with the following:  Prednisone taper Take 6 tabs by mouth daily for 2 days, then 5 tabs for 2 days, then 4 tabs for 2 days, then 3 tabs for 2 days, 2 tabs for 2 days, then 1 tab by mouth daily for 2 days Ibuprofen 800mg  every 8 hours as needed for pain but do not take with the prednisone Can consider making a follow up with your PCP to discuss long term medication for gout. Return to urgent care or PCP if symptoms worsen or fail to resolve.

## 2023-07-03 ENCOUNTER — Encounter (HOSPITAL_COMMUNITY): Payer: Self-pay

## 2023-07-03 ENCOUNTER — Ambulatory Visit (HOSPITAL_COMMUNITY)
Admission: EM | Admit: 2023-07-03 | Discharge: 2023-07-03 | Disposition: A | Discharge status: Home / Self Care | Attending: Emergency Medicine | Admitting: Emergency Medicine

## 2023-07-03 DIAGNOSIS — M10071 Idiopathic gout, right ankle and foot: Secondary | ICD-10-CM

## 2023-07-03 MED ORDER — PREDNISONE 20 MG PO TABS: 40.0000 mg | ORAL_TABLET | Freq: Every day | ORAL | 0 refills | Status: AC

## 2023-07-03 NOTE — ED Provider Notes (Signed)
 [facility]    CSN: [id number] Arrival date & time: 07/03/23  0813      History   Chief Complaint Chief Complaint  Patient presents with   Ankle Pain    HPI Dillon Wolfe is a 53 y.o. male.   Patient presents with right ankle pain and mild swelling that began yesterday.  Patient states that while he was at work he stepped out of his truck wrong and barely twisted his ankle.  Patient states that he has been walking with a slight limp since.  Patient states that he knows he did not injure his ankle, but his place of work told him to get checked out before returning to work.  Patient states he has a history of gout and he believes his gout has flared up from twisting his ankle yesterday.  Patient is requesting " the medication that he normally receives when he has a gout flare" to help with this.  Patient has history of uncontrolled type 2 diabetes and CKD.  Upon discussing this with patient patient states that doctors keep telling him that he has diabetes and kidney disease but he does not believe them.    Patient states that he believes he has been managing his chronic illnesses well with diet and exercise.  Patient denies having a primary care provider.     Ankle Pain   Past Medical History:  Diagnosis Date   Gout     Patient Active Problem List   Diagnosis Date Noted   Type 2 diabetes mellitus with other specified complication (HCC) 01/17/2020   CKD stage 3 due to type 2 diabetes mellitus (HCC) 01/17/2020   Liver abscess 12/21/2019    Past Surgical History:  Procedure Laterality Date   IR RADIOLOGIST EVAL & MGMT  01/17/2020       Home Medications    Prior to Admission medications   Medication Sig Start Date End Date Taking? Authorizing Provider  predniSONE (DELTASONE) 20 MG tablet Take 2 tablets (40 mg total) by mouth daily for 5 days. 07/03/23 07/08/23 Yes Letta Kocher, NP    Family History Family History  Problem Relation Age of  Onset   Obesity Mother    Heart failure Mother    Hypertension Mother    Cancer Father     Social History Social History   Tobacco Use   Smoking status: Light Smoker    Types: Cigars   Smokeless tobacco: Never   Tobacco comments:    black and milds when drinking  Vaping Use   Vaping status: Never Used  Substance Use Topics   Alcohol use: Yes    Comment: Socially   Drug use: No     Allergies   Ivp dye [iodinated contrast media]   Review of Systems Review of Systems  Per HPI  Physical Exam Triage Vital Signs ED Triage Vitals [07/03/23 0832]  Encounter Vitals Group     BP (!) 151/89     Systolic BP Percentile      Diastolic BP Percentile      Pulse Rate 91     Resp 16     Temp 97.6 F (36.4 C)     Temp Source Oral     SpO2 96 %     Weight      Height      Head Circumference      Peak Flow      Pain Score 4     Pain Loc  Pain Education      Exclude from Growth Chart    No data found.  Updated Vital Signs BP (!) 151/89 (BP Location: Right Arm)   Pulse 91   Temp 97.6 F (36.4 C) (Oral)   Resp 16   SpO2 96%   Visual Acuity Right Eye Distance:   Left Eye Distance:   Bilateral Distance:    Right Eye Near:   Left Eye Near:    Bilateral Near:     Physical Exam Vitals and nursing note reviewed.  Constitutional:      General: He is not in acute distress.    Appearance: Normal appearance. He is not ill-appearing.  Musculoskeletal:     Right ankle: No swelling or deformity. Tenderness present. Normal range of motion.  Neurological:     Mental Status: He is alert.      UC Treatments / Results  Labs (all labs ordered are listed, but only abnormal results are displayed) Labs Reviewed - No data to display  EKG   Radiology No results found.  Procedures Procedures (including critical care time)  Medications Ordered in UC Medications - No data to display  Initial Impression / Assessment and Plan / UC Course  I have reviewed the  triage vital signs and the nursing notes.  Pertinent labs & imaging results that were available during my care of the patient were reviewed by me and considered in my medical decision making (see chart for details).     Upon assessment mild tenderness noted to right ankle.  Without swelling or decreased range of motion.  Patient is able to ambulate with a mild limp.  Patient is mainly just requesting a note stating that he can return to work on Monday.  Recommended that patient get established with a primary care provider to further manage his diabetes and chronic kidney disease.  Patient was initially resistant to this stating that he is able to manage those on his own through diet and exercise.  After further discussion with patient he agreed to have [place] set him up with a primary care provider today for further management.  Offered patient blood work today to determine the extent of his kidney disease at this time, patient declined due to concerns of cost. Prescribed a short course of prednisone as requested.  Discussed follow-up and return precautions. Final Clinical Impressions(s) / UC Diagnoses   Final diagnoses:  Acute idiopathic gout of right ankle     Discharge Instructions      Take 2 tablets of prednisone once daily for 5 days. Keep your follow-up appointment with primary care provider that you are established with today for further management of your chronic illnesses. Return here as needed.    ED Prescriptions     Medication Sig Dispense Auth. Provider   predniSONE (DELTASONE) 20 MG tablet Take 2 tablets (40 mg total) by mouth daily for 5 days. 10 tablet Wynonia Lawman A, NP      PDMP not reviewed this encounter.   Wynonia Lawman A, [place] 07/03/23 [phone]

## 2023-07-03 NOTE — ED Triage Notes (Signed)
 Patient reports that he fell out of his truck yesterday and barely turned his ankle. Patient states today, he is having right ankle pain, but states he is out of his gout medicine and insists that is what is causing his pain in the right ankle.

## 2023-07-03 NOTE — Discharge Instructions (Signed)
 Take 2 tablets of prednisone once daily for 5 days. Keep your follow-up appointment with primary care provider that you are established with today for further management of your chronic illnesses. Return here as needed.

## 2023-08-24 ENCOUNTER — Emergency Department (HOSPITAL_COMMUNITY)
Admission: EM | Admit: 2023-08-24 | Discharge: 2023-08-25 | Disposition: A | Discharge status: Home / Self Care | Attending: Emergency Medicine | Admitting: Emergency Medicine

## 2023-08-24 ENCOUNTER — Other Ambulatory Visit: Payer: Self-pay

## 2023-08-24 DIAGNOSIS — F17290 Nicotine dependence, other tobacco product, uncomplicated: Secondary | ICD-10-CM | POA: Diagnosis not present

## 2023-08-24 DIAGNOSIS — E1165 Type 2 diabetes mellitus with hyperglycemia: Secondary | ICD-10-CM | POA: Diagnosis not present

## 2023-08-24 DIAGNOSIS — R739 Hyperglycemia, unspecified: Secondary | ICD-10-CM

## 2023-08-24 DIAGNOSIS — R42 Dizziness and giddiness: Secondary | ICD-10-CM | POA: Diagnosis not present

## 2023-08-24 LAB — COMPREHENSIVE METABOLIC PANEL WITH GFR
ALT: 29 U/L (ref 0–44)
AST: 20 U/L (ref 15–41)
Albumin: 3.9 g/dL (ref 3.5–5.0)
Alkaline Phosphatase: 103 U/L (ref 38–126)
Anion gap: 10 (ref 5–15)
BUN: 16 mg/dL (ref 6–20)
CO2: 23 mmol/L (ref 22–32)
Calcium: 9.6 mg/dL (ref 8.9–10.3)
Chloride: 91 mmol/L — ABNORMAL LOW (ref 98–111)
Creatinine, Ser: 1.28 mg/dL — ABNORMAL HIGH (ref 0.61–1.24)
GFR, Estimated: >60 mL/min (ref >60)
Glucose, Bld: 880 mg/dL (ref 70–99)
Potassium: 4.3 mmol/L (ref 3.5–5.1)
Sodium: 124 mmol/L — ABNORMAL LOW (ref 135–145)
Total Bilirubin: 0.3 mg/dL (ref 0.0–1.2)
Total Protein: 7.6 g/dL (ref 6.5–8.1)

## 2023-08-24 LAB — URINALYSIS, ROUTINE W REFLEX MICROSCOPIC
Bacteria, UA: NONE SEEN
Bilirubin Urine: NEGATIVE
Glucose, UA: >=500 mg/dL — AB
Hgb urine dipstick: NEGATIVE
Ketones, ur: NEGATIVE mg/dL
Leukocytes,Ua: NEGATIVE
Nitrite: NEGATIVE
Protein, ur: NEGATIVE mg/dL
Specific Gravity, Urine: 1.022 (ref 1.005–1.030)
pH: 6 (ref 5.0–8.0)

## 2023-08-24 LAB — CBC
HCT: 42.3 % (ref 39.0–52.0)
Hemoglobin: 14 g/dL (ref 13.0–17.0)
MCH: 26.8 pg (ref 26.0–34.0)
MCHC: 33.1 g/dL (ref 30.0–36.0)
MCV: 80.9 fL (ref 80.0–100.0)
Platelets: 292 10*3/uL (ref 150–400)
RBC: 5.23 MIL/uL (ref 4.22–5.81)
RDW: 13.1 % (ref 11.5–15.5)
WBC: 7.7 10*3/uL (ref 4.0–10.5)
nRBC: 0 % (ref 0.0–0.2)

## 2023-08-24 LAB — CK: Total CK: 114 U/L (ref 49–397)

## 2023-08-24 LAB — TROPONIN I (HIGH SENSITIVITY): Troponin I (High Sensitivity): 5 ng/L (ref <18)

## 2023-08-24 NOTE — ED Provider Triage Note (Signed)
 Emergency Medicine Provider Triage Evaluation Note  Dillon Wolfe , a 53 y.o. male  was evaluated in triage.  Pt complains of lightheadedness with ambulation for the past 2-3 days. The patient reports that he has felt off balance with walking. Earlier today, he dropped his car off and was walking home when he started to feel lightheaded. He reports he doesn't know if he is feeling off balance or just tired as he has been working more and works 5-[employer]. Denies any headache, vision changes, chest pain, SOB, numbness, or tingling. Denies leg pain.   Review of Systems  Positive:  Negative:   Physical Exam  BP 119/78   Pulse 74   Temp 98.9 F (37.2 C)   Resp 18   Ht 5\' 8"  (1.727 m)   Wt 120.2 kg   SpO2 98%   BMI 40.29 kg/m  Gen:   Awake, no distress   Resp:  Normal effort  MSK:   Moves extremities without difficulty  Other:  Patient is ambulatory independently with steady gait.  Able to go from a sitting to standing position to walking with ease and quickly.  Turning around without any wobbling or discoordination.  Does not appear in any acute distress.  On phone talking.  Medical Decision Making  Medically screening exam initiated at 4:36 PM.  Appropriate orders placed.  Dillon Wolfe was informed that the remainder of the evaluation will be completed by another provider, this initial triage assessment does not replace that evaluation, and the importance of remaining in the ED until their evaluation is complete.  Will order labs.   Spence Dux, [place] 08/24/23 [phone]

## 2023-08-24 NOTE — ED Notes (Signed)
 Pt refused 2nd blood draw riley pa is aware at

## 2023-08-24 NOTE — ED Triage Notes (Addendum)
 Pt began feeling dizzy a couple of days ago and today it has gotten worse. Denies CP, SOB, heart palpitations.  Does endorse generalized weakness. Pt states the weakness started when theh dizziness started. No other neuro symptoms noted

## 2023-08-24 NOTE — ED Notes (Addendum)
 Glucose is 880.  EDP notified

## 2023-08-24 NOTE — ED Notes (Signed)
 Patient refused follow up vitals

## 2023-08-25 LAB — CBG MONITORING, ED: Glucose-Capillary: 481 mg/dL — ABNORMAL HIGH (ref 70–99)

## 2023-08-25 LAB — TROPONIN I (HIGH SENSITIVITY): Troponin I (High Sensitivity): 4 ng/L (ref <18)

## 2023-08-25 LAB — BETA-HYDROXYBUTYRIC ACID: Beta-Hydroxybutyric Acid: 1.17 mmol/L — ABNORMAL HIGH (ref 0.05–0.27)

## 2023-08-25 MED ORDER — SODIUM CHLORIDE 0.9 % IV BOLUS
1000.0000 mL | Freq: Once | INTRAVENOUS | Status: AC
  Administered 2023-08-25: 1000 mL via INTRAVENOUS

## 2023-08-25 MED ORDER — INSULIN ASPART 100 UNIT/ML IV SOLN
4.0000 [IU] | Freq: Once | INTRAVENOUS | Status: AC
  Administered 2023-08-25: 4 [IU] via INTRAVENOUS

## 2023-08-25 MED ORDER — METFORMIN HCL 500 MG PO TABS
500.0000 mg | ORAL_TABLET | Freq: Two times a day (BID) | ORAL | 0 refills | Status: DC
Start: 2023-08-25 — End: 2023-10-06

## 2023-08-25 MED ORDER — INSULIN ASPART 100 UNIT/ML IV SOLN: 10.0000 [IU] | Freq: Once | INTRAVENOUS | Status: DC

## 2023-08-25 NOTE — ED Notes (Signed)
 When patient was brought to blue zone tonight, I entered the room to introduce myself as the tech working with him. He looked at me very puzzled and said "I don't understand, I have been here since 3:00 and there is not a single update, you can see my reports and labs and everything and you are not going to tell me if I can go home or stay. I have work at 5:00." I responded with, "I am the CNA assisting you tonight and I cannot go over results with you. The nurse and doctor should be in here soon." Aleene Amor is working in [employer] and told me that he refused vitals in the lobby so I reassessed that question and asked patient if we could update his 4+ overdue vitals and 8+ overdue temp. Patient responded with "No, there is no reason to update my vitals because my vitals are fine and you will just continue to delay my care" I responded with "vitals are a good way to show your status and how you are doing overall so that we know how to best treat you." Patient continues to be agitated. I ended conversation with "I will let the doctor know to come discuss treatment with you and what is next" and left the room. Tiffany, RN informed Dr. Harless Lien of the situation prior him going into patients room for a heads up.

## 2023-08-25 NOTE — Discharge Instructions (Signed)
 You were evaluated in the Emergency Department and after careful evaluation, we did not find any emergent condition requiring admission or further testing in the hospital.  Your exam/testing today is overall reassuring.  Symptoms seem to be due to high blood sugar because you have diabetes.  Take the metformin medication twice daily to keep your blood sugars more normal and follow-up with a primary care doctor.  Avoid sugary foods as we discussed.  Please return to the Emergency Department if you experience any worsening of your condition.   Thank you for allowing us  to be a part of your care.

## 2023-08-25 NOTE — ED Provider Notes (Signed)
 [facility]PT [facility] Emergency Department Provider Note MRN:  [id number]  Arrival date & time: 08/25/23     Chief Complaint   Dizziness   History of Present Illness   Dillon Wolfe is a 53 y.o. year-old male with a history of diabetes presenting to the ED with chief complaint of dizziness.  Malaise fatigue and weakness today, very thirsty today, did not feel right.  Here for evaluation.  Was told that sugar is very high.  Does not take anything for diabetes.  Review of Systems  A thorough review of systems was obtained and all systems are negative except as noted in the HPI and PMH.   Patient's Health History    Past Medical History:  Diagnosis Date   Gout     Past Surgical History:  Procedure Laterality Date   IR RADIOLOGIST EVAL & MGMT  01/17/2020    Family History  Problem Relation Age of Onset   Obesity Mother    Heart failure Mother    Hypertension Mother    Cancer Father     Social History   Socioeconomic History   Marital status: Divorced    Spouse name: Not on file   Number of children: Not on file   Years of education: Not on file   Highest education level: Not on file  Occupational History   Not on file  Tobacco Use   Smoking status: Light Smoker    Types: Cigars   Smokeless tobacco: Never   Tobacco comments:    black and milds when drinking  Vaping Use   Vaping status: Never Used  Substance and Sexual Activity   Alcohol use: Yes    Comment: Socially   Drug use: No   Sexual activity: Not on file  Other Topics Concern   Not on file  Social History Narrative   Not on file   Social Drivers of Health   Financial Resource Strain: Not on file  Food Insecurity: Not on file  Transportation Needs: Not on file  Physical Activity: Not on file  Stress: Not on file  Social Connections: Unknown (08/19/2021)   Received from [facility], [facility]   Social Network    Social Network: Not on file  Intimate Partner Violence:  Unknown (07/11/2021)   Received from [employer], [facility]   HITS    Physically Hurt: Not on file    Insult or Talk Down To: Not on file    Threaten Physical Harm: Not on file    Scream or Curse: Not on file     Physical Exam   Vitals:   08/25/23 0230 08/25/23 0400  BP: 108/89 102/69  Pulse: 72 71  Resp:    Temp:    SpO2: 100% 100%    CONSTITUTIONAL: Well-appearing, NAD NEURO/PSYCH:  Alert and oriented x 3, no focal deficits EYES:  eyes equal and reactive ENT/NECK:  no LAD, no JVD CARDIO: Regular rate, well-perfused, normal S1 and S2 PULM:  CTAB no wheezing or rhonchi GI/GU:  non-distended, non-tender MSK/SPINE:  No gross deformities, no edema SKIN:  no rash, atraumatic   *Additional and/or pertinent findings included in MDM below  Diagnostic and Interventional Summary    EKG Interpretation Date/Time:  Monday Aug 24 2023 15:27:55 EDT Ventricular Rate:  82 PR Interval:  196 QRS Duration:  92 QT Interval:  366 QTC Calculation: 427 R Axis:   67  Text Interpretation: Normal sinus rhythm Normal ECG When compared with ECG of 21-Dec-2019 14:18, PREVIOUS  ECG IS PRESENT Confirmed by Gwenetta Lennert [phone]) on 08/25/2023 1:14:12 AM       Labs Reviewed  COMPREHENSIVE METABOLIC PANEL WITH GFR - Abnormal; Notable for the following components:      Result Value   Sodium 124 (*)    Chloride 91 (*)    Glucose, Bld 880 (*)    Creatinine, Ser 1.28 (*)    All other components within normal limits  URINALYSIS, ROUTINE W REFLEX MICROSCOPIC - Abnormal; Notable for the following components:   Color, Urine COLORLESS (*)    Glucose, UA >=500 (*)    All other components within normal limits  BETA-HYDROXYBUTYRIC ACID - Abnormal; Notable for the following components:   Beta-Hydroxybutyric Acid 1.17 (*)    All other components within normal limits  CBG MONITORING, ED - Abnormal; Notable for the following components:   Glucose-Capillary 481 (*)    All other components within  normal limits  CBC  CK  OSMOLALITY  I-STAT VENOUS BLOOD GAS, ED  CBG MONITORING, ED  TROPONIN I (HIGH SENSITIVITY)  TROPONIN I (HIGH SENSITIVITY)    No orders to display    Medications  sodium chloride  0.9 % bolus 1,000 mL (0 mLs Intravenous Stopped 08/25/23 0317)  insulin aspart (novoLOG) injection 4 Units (4 Units Intravenous Given 08/25/23 0314)     Procedures  /  Critical Care Procedures  ED Course and Medical Decision Making  Initial Impression and Ddx Well-appearing in no acute distress with normal vital signs.  Triage blood sugar 880.  Doubt DKA given his well-appearing nature.  He does not seem to be aware that he has diabetes though it seems that he was diagnosed with diabetes few years ago.  Does not take anything for diabetes.  Will attempt some diabetes education, will try to lower the blood sugar, reassess.  Past medical/surgical history that increases complexity of ED encounter: Diabetes  Interpretation of Diagnostics I personally reviewed the Laboratory Testing and my interpretation is as follows: Hyperglycemia without evidence of DKA.    Patient Reassessment and Ultimate Disposition/Management     Blood sugar significantly improved, no emergent process, patient wants to go home and follow-up in the outpatient setting.  Patient management required discussion with the following services or consulting groups:  None  Complexity of Problems Addressed Acute illness or injury that poses threat of life of bodily function  Additional Data Reviewed and Analyzed Further history obtained from: None  Additional Factors Impacting ED Encounter Risk Consideration of hospitalization  Merrick Abe. Harless Lien, MD [facility] Emergency Medicine [facility] [email or web address]  Final Clinical Impressions(s) / ED Diagnoses     ICD-10-CM   1. Hyperglycemia  R73.9       ED Discharge Orders          Ordered    metFORMIN (GLUCOPHAGE) 500 MG tablet  2 times  daily with meals        08/25/23 0426             Discharge Instructions Discussed with and Provided to Patient:     Discharge Instructions      You were evaluated in the Emergency Department and after careful evaluation, we did not find any emergent condition requiring admission or further testing in the hospital.  Your exam/testing today is overall reassuring.  Symptoms seem to be due to high blood sugar because you have diabetes.  Take the metformin medication twice daily to keep your blood sugars more normal and follow-up with a primary care  doctor.  Avoid sugary foods as we discussed.  Please return to the Emergency Department if you experience any worsening of your condition.   Thank you for allowing us  to be a part of your care.     Edson Graces, MD 08/25/23 [phone]

## 2023-09-09 DIAGNOSIS — H5213 Myopia, bilateral: Secondary | ICD-10-CM | POA: Diagnosis not present

## 2023-09-30 ENCOUNTER — Encounter (HOSPITAL_COMMUNITY): Payer: Self-pay

## 2023-09-30 ENCOUNTER — Ambulatory Visit (HOSPITAL_COMMUNITY)
Admission: EM | Admit: 2023-09-30 | Discharge: 2023-09-30 | Disposition: A | Discharge status: Home / Self Care | Attending: Emergency Medicine | Admitting: Emergency Medicine

## 2023-09-30 DIAGNOSIS — M10071 Idiopathic gout, right ankle and foot: Secondary | ICD-10-CM

## 2023-09-30 HISTORY — DX: Type 2 diabetes mellitus without complications: E11.9

## 2023-09-30 LAB — POCT FASTING CBG KUC MANUAL ENTRY: POCT Glucose (KUC): 171 mg/dL — AB (ref 70–99)

## 2023-09-30 MED ORDER — PREDNISONE 20 MG PO TABS: 40.0000 mg | ORAL_TABLET | Freq: Every day | ORAL | 0 refills | Status: AC

## 2023-09-30 NOTE — ED Provider Notes (Signed)
 [facility]    CSN: [id number] Arrival date & time: 09/30/23  9066      History   Chief Complaint Chief Complaint  Patient presents with   Foot Pain   Toe Pain    HPI Dillon Wolfe is a 53 y.o. male.   Patient presents with right foot and right great toe pain that began yesterday.  Patient reports history of gout and states this pain feels similar to when he has had a gout flare in the past.  Patient denies any recent known injuries to his foot.  Patient has history of type 2 diabetes and chronic kidney disease.  Patient states in the past he has received a few days of prednisone  with relief of this.  Patient states that he was recently seen in the emergency department and at that time his blood glucose was elevated.  Patient states that he was prescribed metformin  at that time.  Patient states he has been taking the metformin  as prescribed since then.  Patient reports that he has also been eating healthier and getting more exercise since then.  Patient reports that he has scheduled a primary care appointment for the beginning of September for further evaluation and management of his diabetes.  Patient also reports that he has an appointment with [facility] coming up to evaluate his foot pain as well.  The history is provided by the patient and medical records.  Foot Pain  Toe Pain    Past Medical History:  Diagnosis Date   Diabetes mellitus without complication (HCC)    Gout     Patient Active Problem List   Diagnosis Date Noted   Type 2 diabetes mellitus with other specified complication (HCC) 01/17/2020   CKD stage 3 due to type 2 diabetes mellitus (HCC) 01/17/2020   Liver abscess 12/21/2019    Past Surgical History:  Procedure Laterality Date   IR RADIOLOGIST EVAL & MGMT  01/17/2020       Home Medications    Prior to Admission medications   Medication Sig Start Date End Date Taking? Authorizing Provider  predniSONE  (DELTASONE ) 20  MG tablet Take 2 tablets (40 mg total) by mouth daily for 5 days. 09/30/23 10/05/23 Yes Johnie Flaming A, NP  metFORMIN  (GLUCOPHAGE ) 500 MG tablet Take 1 tablet (500 mg total) by mouth 2 (two) times daily with a meal. 08/25/23 09/24/23  Bero, Ozell HERO, MD    Family History Family History  Problem Relation Age of Onset   Obesity Mother    Heart failure Mother    Hypertension Mother    Cancer Father     Social History Social History   Tobacco Use   Smoking status: Light Smoker    Types: Cigars   Smokeless tobacco: Never   Tobacco comments:    black and milds when drinking  Vaping Use   Vaping status: Never Used  Substance Use Topics   Alcohol use: Yes    Comment: Socially   Drug use: No     Allergies   Ivp dye [iodinated contrast media]   Review of Systems Review of Systems  Per HPI  Physical Exam Triage Vital Signs ED Triage Vitals  Encounter Vitals Group     BP 09/30/23 0947 115/81     Girls Systolic BP Percentile --      Girls Diastolic BP Percentile --      Boys Systolic BP Percentile --      Boys Diastolic BP Percentile --  Pulse Rate 09/30/23 0947 89     Resp 09/30/23 0947 16     Temp 09/30/23 0947 98.3 F (36.8 C)     Temp Source 09/30/23 0947 Oral     SpO2 09/30/23 0947 97 %     Weight --      Height --      Head Circumference --      Peak Flow --      Pain Score 09/30/23 0946 6     Pain Loc --      Pain Education --      Exclude from Growth Chart --    No data found.  Updated Vital Signs BP 115/81 (BP Location: Right Arm)   Pulse 89   Temp 98.3 F (36.8 C) (Oral)   Resp 16   SpO2 97%   Visual Acuity Right Eye Distance:   Left Eye Distance:   Bilateral Distance:    Right Eye Near:   Left Eye Near:    Bilateral Near:     Physical Exam Vitals and nursing note reviewed.  Constitutional:      General: He is awake. He is not in acute distress.    Appearance: Normal appearance. He is well-developed and well-groomed. He is not  ill-appearing.   Musculoskeletal:     Right foot: Normal range of motion. Swelling and tenderness present. No deformity.     Comments: Mild swelling and tenderness noted over the MTP joint of the right great toe.   Skin:    General: Skin is warm and dry.   Neurological:     Mental Status: He is alert.   Psychiatric:        Behavior: Behavior is cooperative.      UC Treatments / Results  Labs (all labs ordered are listed, but only abnormal results are displayed) Labs Reviewed  POCT FASTING CBG [facility] MANUAL ENTRY - Abnormal; Notable for the following components:      Result Value   POCT Glucose ([facility]) 171 (*)    All other components within normal limits    EKG   Radiology No results found.  Procedures Procedures (including critical care time)  Medications Ordered in UC Medications - No data to display  Initial Impression / Assessment and Plan / UC Course  I have reviewed the triage vital signs and the nursing notes.  Pertinent labs & imaging results that were available during my care of the patient were reviewed by me and considered in my medical decision making (see chart for details).     Patient is overall well-appearing.  Vitals are stable.  Upon assessment there is mild swelling and tenderness noted over the MTP joint of the right great toe.  Presentation is consistent with gout flare.  Check CBG in clinic to ensure that blood sugar is stable prior to prescribing steroids for gout.  CBG mildly elevated at 171.  Patient states that he is continue to take metformin  as prescribed.  Given prednisone  burst for gout flare.  Discussed importance of monitoring blood sugars while on steroids.  Discussed follow-up and return precautions. Final Clinical Impressions(s) / UC Diagnoses   Final diagnoses:  Acute idiopathic gout involving toe of right foot     Discharge Instructions      Start taking 2 tablets of prednisone  once daily for 5 days for treatment of your gout  flare. You can also apply ice to your foot to help with pain and swelling.  Rest and elevate as often  as possible. This can affect your blood sugars.  Make sure you continue taking your metformin , limit your carb intake, and increase your water intake while you are taking this to avoid spikes in your blood sugar. You can monitor your blood sugars at home and if this is significantly increasing your blood sugar then stop taking this. Keep your follow-up with [facility] for further evaluation of your foot pain. Keep your appointment as scheduled in September for your new primary care provider for further evaluation of your diabetes. Return here as needed.     ED Prescriptions     Medication Sig Dispense Auth. Provider   predniSONE  (DELTASONE ) 20 MG tablet Take 2 tablets (40 mg total) by mouth daily for 5 days. 10 tablet Johnie Flaming A, NP      PDMP not reviewed this encounter.   Johnie Flaming A, NP 09/30/23 1215

## 2023-09-30 NOTE — Discharge Instructions (Addendum)
 Start taking 2 tablets of prednisone  once daily for 5 days for treatment of your gout flare. You can also apply ice to your foot to help with pain and swelling.  Rest and elevate as often as possible. This can affect your blood sugars.  Make sure you continue taking your metformin , limit your carb intake, and increase your water intake while you are taking this to avoid spikes in your blood sugar. You can monitor your blood sugars at home and if this is significantly increasing your blood sugar then stop taking this. Keep your follow-up with [facility] for further evaluation of your foot pain. Keep your appointment as scheduled in September for your new primary care provider for further evaluation of your diabetes. Return here as needed.

## 2023-09-30 NOTE — ED Triage Notes (Signed)
 Patient reports that he has been having right foot and right toe pain since yesterday. Patient reports a history of gout and denies any injuries.  Patient denies taking taking any medications for his pain.  Patient states he needs a work note.

## 2023-10-05 ENCOUNTER — Ambulatory Visit: Admitting: Podiatry

## 2023-10-06 ENCOUNTER — Ambulatory Visit: Admitting: Physician Assistant

## 2023-10-06 ENCOUNTER — Ambulatory Visit: Admitting: Adult Health

## 2023-10-06 ENCOUNTER — Telehealth: Payer: Self-pay | Admitting: Emergency Medicine

## 2023-10-06 ENCOUNTER — Telehealth: Payer: Self-pay | Admitting: General Practice

## 2023-10-06 ENCOUNTER — Encounter: Payer: Self-pay | Admitting: Physician Assistant

## 2023-10-06 ENCOUNTER — Ambulatory Visit: Payer: Self-pay

## 2023-10-06 VITALS — BP 128/97 | HR 79 | Ht 68.0 in | Wt 226.0 lb

## 2023-10-06 DIAGNOSIS — Z7984 Long term (current) use of oral hypoglycemic drugs: Secondary | ICD-10-CM

## 2023-10-06 DIAGNOSIS — Z8739 Personal history of other diseases of the musculoskeletal system and connective tissue: Secondary | ICD-10-CM | POA: Diagnosis not present

## 2023-10-06 DIAGNOSIS — Z91013 Allergy to seafood: Secondary | ICD-10-CM

## 2023-10-06 DIAGNOSIS — Z13220 Encounter for screening for lipoid disorders: Secondary | ICD-10-CM | POA: Diagnosis not present

## 2023-10-06 DIAGNOSIS — Z125 Encounter for screening for malignant neoplasm of prostate: Secondary | ICD-10-CM

## 2023-10-06 DIAGNOSIS — E1165 Type 2 diabetes mellitus with hyperglycemia: Secondary | ICD-10-CM

## 2023-10-06 DIAGNOSIS — M109 Gout, unspecified: Secondary | ICD-10-CM | POA: Diagnosis not present

## 2023-10-06 DIAGNOSIS — Z5320 Procedure and treatment not carried out because of patient's decision for unspecified reasons: Secondary | ICD-10-CM | POA: Diagnosis not present

## 2023-10-06 LAB — POCT GLYCOSYLATED HEMOGLOBIN (HGB A1C): Hemoglobin A1C: 12.6 % — AB (ref 4.0–5.6)

## 2023-10-06 MED ORDER — METFORMIN HCL 1000 MG PO TABS: 1000.0000 mg | ORAL_TABLET | Freq: Two times a day (BID) | ORAL | 1 refills | Status: DC

## 2023-10-06 MED ORDER — INDOMETHACIN 50 MG PO CAPS: 50.0000 mg | ORAL_CAPSULE | Freq: Three times a day (TID) | ORAL | 0 refills | Status: DC

## 2023-10-06 NOTE — Patient Instructions (Addendum)
 VISIT SUMMARY:  Today, we discussed the management of your type 2 diabetes, gout, and general health maintenance. We reviewed your current medications, dietary habits, and the importance of regular follow-ups to manage your conditions effectively.  YOUR PLAN:  -TYPE 2 DIABETES MELLITUS: Type 2 diabetes is a condition where your body does not use insulin  properly, leading to high blood sugar levels. Your recent A1c level indicates poor blood sugar control. We have increased your metformin  dose to 1000 mg twice daily and referred you to the [facility] and [facility] for a continuous glucose monitor program. It's important to reduce added sugars in your diet. We will reassess your blood glucose levels in one month.  -GOUT: Gout is a type of arthritis caused by high levels of uric acid in the blood, leading to joint inflammation. You have recurrent gout attacks, possibly triggered by your diet. We have ordered a uric acid level test and prescribed indomethacin for acute flare-ups. If your uric acid levels are elevated, we may consider starting you on allopurinol.  -GENERAL HEALTH MAINTENANCE: We discussed the importance of regular health screenings and lifestyle modifications. You declined colon cancer screening, but we will proceed with tests for cholesterol, prostate cancer, thyroid function, and vitamin D levels. We will also perform a microalbumin urine test to check your kidney function and refer you to an allergist for allergy testing.  Mediterranean Diet A Mediterranean diet is based on the traditions of countries on the [employer]. It focuses on eating more: Fruits and vegetables. Whole grains, beans, nuts, and seeds. Heart-healthy fats. These are fats that are good for your heart. It involves eating less: Dairy. Meat and eggs. Processed foods with added sugar, salt, and fat. This type of diet can help prevent certain conditions. It can also improve outcomes if you have a  long-term (chronic) disease, such as kidney or heart disease. What are tips for following this plan? Reading food labels Check packaged foods for: The serving size. For foods such as rice and pasta, the serving size is the amount of cooked product, not dry. The total fat. Avoid foods with saturated fat or trans fat. Added sugars, such as corn syrup. Shopping  Try to have a balanced diet. Buy a variety of foods, such as: Fresh fruits and vegetables. You may be able to get these from local farmers markets. You can also buy them frozen. Grains, beans, nuts, and seeds. Some of these can be bought in bulk. Fresh seafood. Poultry and eggs. Low-fat dairy products. Buy whole ingredients instead of foods that have already been packaged. If you can't get fresh seafood, buy precooked frozen shrimp or canned fish, such as tuna, salmon, or sardines. Stock your pantry so you always have certain foods on hand, such as olive oil, canned tuna, canned tomatoes, rice, pasta, and beans. Cooking Cook foods with extra-virgin olive oil instead of using butter or other vegetable oils. Have meat as a side dish. Have vegetables or grains as your main dish. This means having meat in small portions or adding small amounts of meat to foods like pasta or stew. Use beans or vegetables instead of meat in common dishes like chili or lasagna. Try out different cooking methods. Try roasting, broiling, steaming, and sauting vegetables. Add frozen vegetables to soups, stews, pasta, or rice. Add nuts or seeds for added healthy fats and plant protein at each meal. You can add these to yogurt, salads, or vegetable dishes. Marinate fish or vegetables using olive oil, lemon  juice, garlic, and fresh herbs. Meal planning Plan to eat a vegetarian meal one day each week. Try to work up to two vegetarian meals, if possible. Eat seafood two or more times a week. Have healthy snacks on hand. These may include: Vegetable sticks with  hummus. Greek yogurt. Fruit and nut trail mix. Eat balanced meals. These should include: Fruit: 2-3 servings a day. Vegetables: 4-5 servings a day. Low-fat dairy: 2 servings a day. Fish, poultry, or lean meat: 1 serving a day. Beans and legumes: 2 or more servings a week. Nuts and seeds: 1-2 servings a day. Whole grains: 6-8 servings a day. Extra-virgin olive oil: 3-4 servings a day. Limit red meat and sweets to just a few servings a month. Lifestyle  Try to cook and eat meals with your family. Drink enough fluid to keep your pee (urine) pale yellow. Be active every day. This includes: Aerobic exercise, which is exercise that causes your heart to beat faster. Examples include running and swimming. Leisure activities like gardening, walking, or housework. Get 7-8 hours of sleep each night. Drink red wine if your provider says you can. A glass of wine is 5 oz (150 mL). You may be allowed to have: Up to 1 glass a day if you're male and not pregnant. Up to 2 glasses a day if you're male. What foods should I eat? Fruits Apples. Apricots. Avocado. Berries. Bananas. Cherries. Dates. Figs. Grapes. Lemons. Melon. Oranges. Peaches. Plums. Pomegranate. Vegetables Artichokes. Beets. Broccoli. Cabbage. Carrots. Eggplant. Green beans. Chard. Kale. Spinach. Onions. Leeks. Peas. Squash. Tomatoes. Peppers. Radishes. Grains Whole-grain pasta. Brown rice. Bulgur wheat. Polenta. Couscous. Whole-wheat bread. [place] Madeira. Meats and other proteins Beans. Almonds. Sunflower seeds. Pine nuts. Peanuts. Cod. Salmon. Scallops. Shrimp. Tuna. Tilapia. Clams. Oysters. Eggs. Chicken or [place] without skin. Dairy Low-fat milk. Cheese. Greek yogurt. Fats and oils Extra-virgin olive oil. Avocado oil. Grapeseed oil. Beverages Water. Red wine. Herbal tea. Sweets and desserts Greek yogurt with honey. Baked apples. Poached pears. Trail mix. Seasonings and condiments Basil. Cilantro. Coriander. Cumin. Mint.  Parsley. Sage. Rosemary. Tarragon. Garlic. Oregano. Thyme. Pepper. Balsamic vinegar. Tahini. Hummus. Tomato sauce. Olives. Mushrooms. The items listed above may not be all the foods and drinks you can have. Talk to a dietitian to learn more. What foods should I limit? This is a list of foods that should be eaten rarely. Fruits Fruit canned in syrup. Vegetables Deep-fried potatoes, like [place] fries. Grains Packaged pasta or rice dishes. Cereal with added sugar. Snacks with added sugar. Meats and other proteins Beef. Pork. Lamb. Chicken or [place] with skin. Hot dogs. [place]. Dairy Ice cream. Sour cream. Whole milk. Fats and oils Butter. Canola oil. Vegetable oil. Beef fat (tallow). Lard. Beverages Juice. Sugar-sweetened soft drinks. Beer. Liquor and spirits. Sweets and desserts Cookies. Cakes. Pies. Candy. Seasonings and condiments Mayonnaise. Pre-made sauces and marinades. The items listed above may not be all the foods and drinks you should limit. Talk to a dietitian to learn more. Where to find more information American Heart Association (AHA): heart.org This information is not intended to replace advice given to you by your health care provider. Make sure you discuss any questions you have with your health care provider. Document Revised: 07/06/2022 Document Reviewed: 07/06/2022 Elsevier Patient Education  2024 [employer].

## 2023-10-06 NOTE — Telephone Encounter (Unsigned)
 Copied from CRM [id number]. Topic: Clinical - Medication Refill >> Oct 06, 2023  1:42 PM Shamecia H wrote: Medication: metFORMIN  (GLUCOPHAGE ) 500 MG tablet, predniSONE  (DELTASONE ) 20 MG tablet [[id number]]   Has the patient contacted their pharmacy? Yes (Agent: If no, request that the patient contact the pharmacy for the refill. If patient does not wish to contact the pharmacy document the reason why and proceed with request.) (Agent: If yes, when and what did the pharmacy advise?)  This is the patient's preferred pharmacy:  [id number] - [place], [place] - [address] AT [facility] [address] [place] [address] Phone: [phone] Fax: [phone]  Is this the correct pharmacy for this prescription? Yes If no, delete pharmacy and type the correct one.   Has the prescription been filled recently? Yes  Is the patient out of the medication? Yes  Has the patient been seen for an appointment in the last year OR does the patient have an upcoming appointment? Yes  Can we respond through MyChart? Yes  Agent: Please be advised that Rx refills may take up to 3 business days. We ask that you follow-up with your pharmacy.

## 2023-10-06 NOTE — Telephone Encounter (Signed)
 Patient given instruction for location of mobile bus today at [facility] for medication refill. He verbalizes understanding and thanked me for my call.  Copied from CRM [id number]. Topic: Clinical - Medication Refill >> Oct 06, 2023  1:42 PM Shamecia H wrote: Medication: metFORMIN  (GLUCOPHAGE ) 500 MG tablet, predniSONE  (DELTASONE ) 20 MG tablet [[id number]]   Has the patient contacted their pharmacy? Yes (Agent: If no, request that the patient contact the pharmacy for the refill. If patient does not wish to contact the pharmacy document the reason why and proceed with request.) (Agent: If yes, when and what did the pharmacy advise?)  This is the patient's preferred pharmacy:  [id number] - [place], [place] - [address] AT [facility] [address] [place] [address] Phone: [phone] Fax: [phone]  Is this the correct pharmacy for this prescription? Yes If no, delete pharmacy and type the correct one.   Has the prescription been filled recently? Yes  Is the patient out of the medication? Yes  Has the patient been seen for an appointment in the last year OR does the patient have an upcoming appointment? Yes  Can we respond through MyChart? Yes  Agent: Please be advised that Rx refills may take up to 3 business days. We ask that you follow-up with your pharmacy. Reason for Disposition  General information question, no triage required and triager able to answer question  Answer Assessment - Initial Assessment Questions 1. REASON FOR CALL or QUESTION: What is your reason for calling today? or How can I best help you? or What question do you have that I can help answer?     Patient contacted for advice on receiving prescription refills until he can be seen in office by new [facility] PCP  Protocols used: Information Only Call - No Triage-A-AH

## 2023-10-06 NOTE — Progress Notes (Signed)
 New Patient Office Visit  Subjective    Patient ID: Dillon Wolfe, male    DOB: May 17, 1970  Age: 53 y.o. MRN: [id number]  CC:  Chief Complaint  Patient presents with   Medication Refill    Metformin - Patient has been without medication since yesterday evening.    Discussed the use of AI scribe software for clinical note transcription with the patient, who gave verbal consent to proceed.  History of Present Illness   Dillon Wolfe is a 53 year old male with type 2 diabetes who presents for medication refills and management of his conditions.  He was started on metformin  a month ago after being seen in UC for hyperglycemia with a blood sugar level of 880 mg/dL. He is on metformin  500 mg twice daily but missed his dose last night. His  A1c today is 12.6%, indicating poor glycemic control.This was not checked 1 month ago when has seen at [facility].  Last check was 3 years ago and was 7.2.  He does not have a home blood glucose monitoring system. His diet includes sugary drinks and snacks, which may contribute to elevated blood glucose levels. He drinks two 16-ounce Sprites during his work shift due to restrictions on having water on the floor and occasionally consumes small candies, Little Debbie cake, and beer.  He has recurrent gout attacks, often triggered by seafood or red meat. He is allergic to shrimp and imitation crab meat. He uses prednisone  for acute gout management.  He would like to be seen for allergy testing.      10/06/2023    4:22 PM 01/17/2020    1:47 PM  Depression screen PHQ 2/9  Decreased Interest 0 0  Down, Depressed, Hopeless 0 0  PHQ - 2 Score 0 0  Altered sleeping 2   Tired, decreased energy 0   Change in appetite 0   Feeling bad or failure about yourself  0   Trouble concentrating 0   Moving slowly or fidgety/restless 0   Suicidal thoughts 1   PHQ-9 Score 3   Difficult doing work/chores Not difficult at all    Adamantly denies any thoughts of self  harm, states depressed moods last about 5 minutes   Results LABS A1c: 12.6% (09/06/2023)    Outpatient Encounter Medications as of 10/06/2023  Medication Sig   indomethacin (INDOCIN) 50 MG capsule Take 1 capsule (50 mg total) by mouth 3 (three) times daily with meals. Cont for 2-3 days after pain resolves   [DISCONTINUED] metFORMIN  (GLUCOPHAGE ) 500 MG tablet Take 1 tablet (500 mg total) by mouth 2 (two) times daily with a meal.   metFORMIN  (GLUCOPHAGE ) 1000 MG tablet Take 1 tablet (1,000 mg total) by mouth 2 (two) times daily with a meal.   No facility-administered encounter medications on file as of 10/06/2023.    Past Medical History:  Diagnosis Date   Diabetes mellitus without complication (HCC)    Gout     Past Surgical History:  Procedure Laterality Date   IR RADIOLOGIST EVAL & MGMT  01/17/2020    Family History  Problem Relation Age of Onset   Obesity Mother    Heart failure Mother    Hypertension Mother    Cancer Father     Social History   Socioeconomic History   Marital status: Divorced    Spouse name: Not on file   Number of children: Not on file   Years of education: Not on file   Highest education level: Not on file  Occupational History   Not on file  Tobacco Use   Smoking status: Light Smoker    Types: Cigars   Smokeless tobacco: Never   Tobacco comments:    black and milds when drinking  Vaping Use   Vaping status: Never Used  Substance and Sexual Activity   Alcohol use: Yes    Comment: Socially   Drug use: No   Sexual activity: Not on file  Other Topics Concern   Not on file  Social History Narrative   Not on file   Social Drivers of Health   Financial Resource Strain: Not on file  Food Insecurity: Not on file  Transportation Needs: Not on file  Physical Activity: Not on file  Stress: Not on file  Social Connections: Unknown (08/19/2021)   Received from [facility]   Social Network    Social Network: Not on file  Intimate  Partner Violence: Unknown (07/11/2021)   Received from [facility]   HITS    Physically Hurt: Not on file    Insult or Talk Down To: Not on file    Threaten Physical Harm: Not on file    Scream or Curse: Not on file    Review of Systems  Constitutional: Negative.   HENT: Negative.    Eyes:  Negative for blurred vision.  Respiratory:  Negative for shortness of breath.   Cardiovascular:  Negative for chest pain.  Gastrointestinal: Negative.   Genitourinary: Negative.   Musculoskeletal: Negative.   Skin: Negative.   Neurological: Negative.   Endo/Heme/Allergies: Negative.   Psychiatric/Behavioral: Negative.          Objective    BP (!) 128/97 (BP Location: Left Arm, Patient Position: Sitting, Cuff Size: Large)   Pulse 79   Ht 5' 8 (1.727 m)   Wt 226 lb (102.5 kg)   SpO2 97%   BMI 34.36 kg/m   Physical Exam  GENERAL: Alert, cooperative, well developed, no acute distress. HEENT: Normocephalic, normal oropharynx, moist mucous membranes. CHEST: Clear to auscultation bilaterally, no wheezes, rhonchi, or crackles. CARDIOVASCULAR: Regular rate and rhythm, S1 and S2 normal without murmurs, rubs, or gallops. ABDOMEN: Soft, non-tender, non-distended, without organomegaly, normal bowel sounds. EXTREMITIES: No cyanosis or edema. NEUROLOGICAL: Cranial nerves grossly intact, moves all extremities without gross motor or sensory deficit.    Assessment & Plan:   Problem List Items Addressed This Visit   None Visit Diagnoses       Type 2 diabetes mellitus with hyperglycemia, without long-term current use of insulin  (HCC)    -  Primary   Relevant Medications   metFORMIN  (GLUCOPHAGE ) 1000 MG tablet   Other Relevant Orders   TSH   Microalbumin / creatinine urine ratio   Vitamin D, 25-hydroxy     Screening PSA (prostate specific antigen)       Relevant Orders   PSA     Screening, lipid       Relevant Orders   Lipid panel     History of gout       Relevant Medications    indomethacin (INDOCIN) 50 MG capsule   Other Relevant Orders   Uric Acid     Seafood allergy       Relevant Orders   Ambulatory referral to Allergy     Colon cancer screening declined           Assessment and Plan Type 2 Diabetes Mellitus Poor glycemic control with A1c at 12.6%. Recommended increasing metformin  and discussed continuous glucose monitoring  benefits. Declines home glucose monitoring kit (does not want to prick his fingers) - Increase metformin  to 1000 mg twice daily. - Refer to [facility] for continuous glucose monitor program. - Educate on reducing added sugars in diet. - Follow up in one month to reassess blood glucose levels.  Gout Recurrent attacks possibly triggered by diet. Advised against frequent prednisone  use due to blood glucose impact. Indomethacin recommended for acute flare-ups. - Order uric acid level test. - Prescribe indomethacin for acute gout flare-ups. - Consider allopurinol if uric acid levels are elevated.  General Health Maintenance Discussed regular screenings and lifestyle modifications. Declined colon cancer screening. Recent eye exam completed. - Order cholesterol, prostate cancer screening, thyroid function, and vitamin D level tests. - Perform microalbumin urine test for kidney function. - Refer to allergist for allergy testing.  Follow-up Emphasized importance of regular follow-up for chronic condition management. - Schedule follow-up appointment in one month to reassess diabetes management. - Coordinate with [facility] for continuous glucose monitor program.   I have reviewed the patient's medical history (PMH, PSH, Social History, Family History, Medications, and allergies) , and have been updated if relevant. I spent 50 minutes reviewing chart and  face to face time with patient.   Return in about 4 weeks (around 11/03/2023) for [facility] with Herlene Salinas Ausdall.   Kirk RAMAN Mayers,  PA-C

## 2023-10-06 NOTE — Telephone Encounter (Unsigned)
 Copied from CRM [id number]. Topic: Clinical - Prescription Issue >> Oct 06, 2023  5:39 PM Donee H wrote: Reason for CRM: Patient states at pharmacy but there was an issue with metFORMIN  (GLUCOPHAGE ) 500 MG tablet. Pharmacy states received order but can not fill due to error in order processing. It's stating unable to fill due to insurance coverage. Patient is stating he does not have insurance and need medication. Please follow up with patient on further instructions on what to do. Call back number [phone]

## 2023-10-07 NOTE — Telephone Encounter (Signed)
 Spoke with patient and he stated that he received his medication yesterday and paid out of pocket.  Patient is currently a [facility] patient and will be establishing later this month.

## 2023-10-07 NOTE — Telephone Encounter (Signed)
 Tried calling patient back, LMOM to return call.

## 2023-10-13 ENCOUNTER — Ambulatory Visit: Payer: Self-pay | Admitting: Physician Assistant

## 2023-10-13 DIAGNOSIS — E559 Vitamin D deficiency, unspecified: Secondary | ICD-10-CM

## 2023-10-13 DIAGNOSIS — Z8739 Personal history of other diseases of the musculoskeletal system and connective tissue: Secondary | ICD-10-CM

## 2023-10-13 DIAGNOSIS — Z9189 Other specified personal risk factors, not elsewhere classified: Secondary | ICD-10-CM

## 2023-10-13 LAB — MICROALBUMIN / CREATININE URINE RATIO
Creatinine, Urine: 135.4 mg/dL
Microalb/Creat Ratio: 10 mg/g{creat} (ref 0–29)
Microalbumin, Urine: 12.9 ug/mL

## 2023-10-13 LAB — VITAMIN D 25 HYDROXY (VIT D DEFICIENCY, FRACTURES): Vit D, 25-Hydroxy: 16.3 ng/mL — ABNORMAL LOW (ref 30.0–100.0)

## 2023-10-13 LAB — LIPID PANEL
Chol/HDL Ratio: 3.4 ratio (ref 0.0–5.0)
Cholesterol, Total: 188 mg/dL (ref 100–199)
HDL: 55 mg/dL (ref >39)
LDL Chol Calc (NIH): 103 mg/dL — ABNORMAL HIGH (ref 0–99)
Triglycerides: 173 mg/dL — ABNORMAL HIGH (ref 0–149)
VLDL Cholesterol Cal: 30 mg/dL (ref 5–40)

## 2023-10-13 LAB — SPECIMEN STATUS REPORT

## 2023-10-13 LAB — URIC ACID: Uric Acid: 7.1 mg/dL (ref 3.8–8.4)

## 2023-10-13 LAB — PSA: Prostate Specific Ag, Serum: 2.9 ng/mL (ref 0.0–4.0)

## 2023-10-13 LAB — TSH: TSH: 2.64 u[IU]/mL (ref 0.450–4.500)

## 2023-10-13 MED ORDER — ALLOPURINOL 100 MG PO TABS: 100.0000 mg | ORAL_TABLET | Freq: Every day | ORAL | 1 refills | Status: DC

## 2023-10-13 MED ORDER — ATORVASTATIN CALCIUM 10 MG PO TABS: 10.0000 mg | ORAL_TABLET | Freq: Every day | ORAL | 2 refills | Status: AC

## 2023-10-13 MED ORDER — VITAMIN D (ERGOCALCIFEROL) 1.25 MG (50000 UNIT) PO CAPS
50000.0000 [IU] | ORAL_CAPSULE | ORAL | 2 refills | Status: AC
Start: 2023-10-13 — End: ?

## 2023-10-28 ENCOUNTER — Other Ambulatory Visit: Payer: Self-pay | Admitting: Physician Assistant

## 2023-10-28 DIAGNOSIS — E1165 Type 2 diabetes mellitus with hyperglycemia: Secondary | ICD-10-CM

## 2023-10-29 ENCOUNTER — Encounter: Payer: Self-pay | Admitting: Orthopedic Surgery

## 2023-10-29 ENCOUNTER — Ambulatory Visit: Admitting: Orthopedic Surgery

## 2023-10-29 VITALS — BP 110/76 | HR 91 | Temp 97.7°F | Resp 17 | Ht 68.0 in | Wt 243.6 lb

## 2023-10-29 DIAGNOSIS — I10 Essential (primary) hypertension: Secondary | ICD-10-CM | POA: Diagnosis not present

## 2023-10-29 DIAGNOSIS — E66812 Obesity, class 2: Secondary | ICD-10-CM | POA: Diagnosis not present

## 2023-10-29 DIAGNOSIS — M1A9XX0 Chronic gout, unspecified, without tophus (tophi): Secondary | ICD-10-CM | POA: Diagnosis not present

## 2023-10-29 DIAGNOSIS — F17290 Nicotine dependence, other tobacco product, uncomplicated: Secondary | ICD-10-CM

## 2023-10-29 DIAGNOSIS — E661 Drug-induced obesity: Secondary | ICD-10-CM

## 2023-10-29 DIAGNOSIS — E1169 Type 2 diabetes mellitus with other specified complication: Secondary | ICD-10-CM

## 2023-10-29 DIAGNOSIS — R21 Rash and other nonspecific skin eruption: Secondary | ICD-10-CM

## 2023-10-29 DIAGNOSIS — Z6837 Body mass index (BMI) 37.0-37.9, adult: Secondary | ICD-10-CM | POA: Diagnosis not present

## 2023-10-29 MED ORDER — AMLODIPINE BESYLATE 2.5 MG PO TABS
2.5000 mg | ORAL_TABLET | Freq: Every day | ORAL | 1 refills | Status: AC
Start: 2023-10-29 — End: ?

## 2023-10-29 NOTE — Patient Instructions (Addendum)
 Consider genetic testing to learn blood type   Start amlodipine  for kidney protection from diabetes   My fitness pal free app> track calories x 1 week  Exercise 150 minutes/week  Eat less than 2000 calories/day

## 2023-10-29 NOTE — Progress Notes (Unsigned)
 Careteam: Patient Care Team: Gil Greig BRAVO, NP as PCP - General (Adult Health Nurse Practitioner)  Seen by: Greig Gil, AGNP-C  PLACE OF SERVICE:  [facility]  Advanced Directive information    Allergies  Allergen Reactions  . Ivp Dye [Iodinated Contrast Media]     Chief Complaint  Patient presents with  . Establish Care    New patient.      HPI: Patient is a 53 y.o. male seen today to establish at [facility].     native. 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 Divorced. 1 daughter aged 78.   Denies MI or cancer.   Recently diagnosed with T2DM.   College    Review of Systems:  ROS***  Past Medical History:  Diagnosis Date  . Diabetes mellitus without complication (HCC)   . Gout    Past Surgical History:  Procedure Laterality Date  . IR RADIOLOGIST EVAL & MGMT  01/17/2020   Social History:   reports that he has been smoking cigars. He has never used smokeless tobacco. He reports current alcohol use. He reports that he does not use drugs.  Family History  Problem Relation Age of Onset  . Obesity Mother   . Heart failure Mother   . Hypertension Mother   . Cancer Father     Medications: Patient's Medications  New Prescriptions   No medications on file  Previous Medications   ALLOPURINOL  (ZYLOPRIM ) 100 MG TABLET    Take 1 tablet (100 mg total) by mouth daily.   ATORVASTATIN  (LIPITOR) 10 MG TABLET    Take 1 tablet (10 mg total) by mouth daily.   INDOMETHACIN  (INDOCIN ) 50 MG CAPSULE    Take 1 capsule (50 mg total) by mouth 3 (three) times daily with meals. Cont for 2-3 days after pain resolves   METFORMIN  (GLUCOPHAGE ) 1000 MG TABLET    Take 1 tablet (1,000 mg total) by mouth 2 (two) times daily with a meal.   VITAMIN D , ERGOCALCIFEROL , (DRISDOL ) 1.25 MG (50000 UNIT) CAPS CAPSULE    Take 1 capsule (50,000 Units total) by mouth every 7 (seven) days.  Modified Medications   No medications on file  Discontinued  Medications   No medications on file    Physical Exam:  Vitals:   10/29/23 1412  BP: 110/76  Pulse: 91  Resp: 17  Temp: 97.7 F (36.5 C)  SpO2: 97%  Weight: 243 lb 9.6 oz (110.5 kg)  Height: 5' 8 (1.727 m)   Body mass index is 37.04 kg/m. Wt Readings from Last 3 Encounters:  10/29/23 243 lb 9.6 oz (110.5 kg)  10/06/23 226 lb (102.5 kg)  08/24/23 264 lb 15.9 oz (120.2 kg)    Physical Exam***  Labs reviewed: Basic Metabolic Panel: Recent Labs    08/24/23 1647 10/06/23 1654  NA 124*  --   K 4.3  --   CL 91*  --   CO2 23  --   GLUCOSE 880*  --   BUN 16  --   CREATININE 1.28*  --   CALCIUM  9.6  --   TSH  --  2.640   Liver Function Tests: Recent Labs    08/24/23 1647  AST 20  ALT 29  ALKPHOS 103  BILITOT 0.3  PROT 7.6  ALBUMIN  3.9   No results for input(s): LIPASE, AMYLASE in the last 8760 hours. No results for input(s): AMMONIA in the last 8760 hours. CBC: Recent Labs  08/24/23 1647  WBC 7.7  HGB 14.0  HCT 42.3  MCV 80.9  PLT 292   Lipid Panel: Recent Labs    10/06/23 1654  CHOL 188  HDL 55  LDLCALC 103*  TRIG 173*  CHOLHDL 3.4   TSH: Recent Labs    10/06/23 1654  TSH 2.640   A1C: Lab Results  Component Value Date   HGBA1C 12.6 (A) 10/06/2023     Assessment/Plan There are no diagnoses linked to this encounter.  Next appt: *** Alexy Bringle Gil BODILY  [facility] [phone]

## 2023-10-30 ENCOUNTER — Other Ambulatory Visit: Payer: Self-pay | Admitting: Orthopedic Surgery

## 2023-10-30 ENCOUNTER — Telehealth: Payer: Self-pay | Admitting: Orthopedic Surgery

## 2023-10-30 NOTE — Telephone Encounter (Signed)
 Patient is active on mychart, I sent patient a mychart message as another avenue of communication

## 2023-10-30 NOTE — Telephone Encounter (Signed)
 Seen as new patient yesterday. I am concerned metformin  1000 mg twice daily will not bring A1c down enough. I recommend starting insulin , it would be one injection daily. He would also have to check blood sugars at least 2 daily when starting insulin . I have tried to call patient twice no answer. Can we try to reach out to him again to communicate this information. Please let me know if he agrees to insulin  and home glucose monitoring and I will send scripts to pharmacy.

## 2023-11-02 ENCOUNTER — Telehealth: Payer: Self-pay | Admitting: Orthopedic Surgery

## 2023-11-02 NOTE — Telephone Encounter (Signed)
Confirmed appt for 7/29

## 2023-11-03 ENCOUNTER — Ambulatory Visit: Admitting: Pharmacist

## 2023-11-13 ENCOUNTER — Other Ambulatory Visit: Payer: Self-pay | Admitting: Physician Assistant

## 2023-11-13 DIAGNOSIS — Z8739 Personal history of other diseases of the musculoskeletal system and connective tissue: Secondary | ICD-10-CM

## 2023-11-23 ENCOUNTER — Ambulatory Visit: Admitting: Internal Medicine

## 2023-12-10 ENCOUNTER — Telehealth: Payer: Self-pay | Admitting: Orthopedic Surgery

## 2023-12-10 ENCOUNTER — Ambulatory Visit: Admitting: Family Medicine

## 2023-12-10 NOTE — Telephone Encounter (Signed)
 Called pt and left vm to call office back to reschedule missed NP appt at [facility]

## 2024-01-06 ENCOUNTER — Other Ambulatory Visit: Payer: Self-pay

## 2024-01-06 ENCOUNTER — Encounter (HOSPITAL_COMMUNITY): Payer: Self-pay | Admitting: Emergency Medicine

## 2024-01-06 ENCOUNTER — Ambulatory Visit (HOSPITAL_COMMUNITY)
Admission: EM | Admit: 2024-01-06 | Discharge: 2024-01-06 | Disposition: A | Discharge status: Home / Self Care | Attending: Family Medicine | Admitting: Family Medicine

## 2024-01-06 ENCOUNTER — Ambulatory Visit: Payer: Self-pay

## 2024-01-06 DIAGNOSIS — E1169 Type 2 diabetes mellitus with other specified complication: Secondary | ICD-10-CM

## 2024-01-06 DIAGNOSIS — M109 Gout, unspecified: Secondary | ICD-10-CM | POA: Diagnosis not present

## 2024-01-06 LAB — POCT FASTING CBG KUC MANUAL ENTRY: POCT Glucose (KUC): 111 mg/dL — AB (ref 70–99)

## 2024-01-06 MED ORDER — COLCHICINE 0.6 MG PO TABS: ORAL_TABLET | ORAL | 0 refills | Status: DC

## 2024-01-06 MED ORDER — PREDNISONE 20 MG PO TABS: 40.0000 mg | ORAL_TABLET | Freq: Every day | ORAL | 0 refills | Status: DC

## 2024-01-06 NOTE — ED Provider Notes (Signed)
 [facility]   [id number] 01/06/24 Arrival Time: 1246  ASSESSMENT & PLAN:  1. Type 2 diabetes mellitus with other specified complication, without long-term current use of insulin  (HCC)   2. Acute gout of right ankle, unspecified cause      CSN: [id number] Arrival date & time: 01/06/24  1246       History              Chief Complaint    Chief Complaint  Patient presents with   Ankle Pain      HPI Dillon Wolfe is a 53 y.o. male.   Pt with a hx of DM2, CKD, and gout presents with 2 days of R ankle pain and swelling. He reports he turned his R ankle wrong while he was at work on Sunday and the pain started the next day. He presents on crutches and has difficulty breathing weight due to the pain. Pt believes that he is having a gout flare up due to the injury and he states the pain is similar to previous gout flares. Her has been taking APAP without relief. He reports he stopped taking allopurinol  because it does not help him. He also reports he has finished his metformin  and believes he has improved his blood glucose.   The history is provided by the patient.  Ankle Pain Associated symptoms: no fever          Past Medical History:  Diagnosis Date   Diabetes mellitus without complication (HCC)     Gout                Patient Active Problem List    Diagnosis Date Noted   Type 2 diabetes mellitus with other specified complication (HCC) 01/17/2020   CKD stage 3 due to type 2 diabetes mellitus (HCC) 01/17/2020   Liver abscess 12/21/2019           Past Surgical History:  Procedure Laterality Date   IR RADIOLOGIST EVAL & MGMT   01/17/2020                Home Medications                       Prior to Admission medications   Medication Sig Start Date End Date Taking? Authorizing Provider  allopurinol  (ZYLOPRIM ) 100 MG tablet TAKE 1 TABLET BY MOUTH EVERY DAY 11/13/23     Tanda Bleacher, MD  amLODipine  (NORVASC ) 2.5 MG tablet Take 1 tablet (2.5 mg  total) by mouth daily. 10/29/23     [name], Amy E, NP  atorvastatin  (LIPITOR) 10 MG tablet Take 1 tablet (10 mg total) by mouth daily. 10/13/23     Mayers, Cari S, PA-C  metFORMIN  (GLUCOPHAGE ) 1000 MG tablet Take 1 tablet (1,000 mg total) by mouth 2 (two) times daily with a meal. Patient not taking: Reported on 01/06/2024 10/06/23 12/05/23   Mayers, Cari S, PA-C  Vitamin D , Ergocalciferol , (DRISDOL ) 1.25 MG (50000 UNIT) CAPS capsule Take 1 capsule (50,000 Units total) by mouth every 7 (seven) days. 10/13/23     Mayers, Kirk RAMAN, PA-C      Family History      Family History  Problem Relation Age of Onset   Obesity Mother     Heart failure Mother     Hypertension Mother     Cancer Father            Social History Social History  Social History  Tobacco Use   Smoking status: Light Smoker      Types: Cigars   Smokeless tobacco: Never   Tobacco comments:      black and milds when drinking  Vaping Use   Vaping status: Never Used  Substance Use Topics   Alcohol use: Yes      Comment: Socially   Drug use: No          Allergies              Ivp dye [iodinated contrast media]     Review of Systems Review of Systems  Constitutional:  Negative for fever.  Musculoskeletal:  Positive for arthralgias and joint swelling.  Neurological:  Negative for numbness.       Physical Exam Updated Vital Signs BP (!) 140/96 (BP Location: Right Arm) Comment (BP Location): large cuff  Pulse 82   Temp 97.7 F (36.5 C) (Oral)   Resp 20   SpO2 98%    Physical Exam Constitutional:      Appearance: Normal appearance.  Cardiovascular:     Rate and Rhythm: Normal rate and regular rhythm.     Pulses: Normal pulses.     Heart sounds: Normal heart sounds.  Pulmonary:     Effort: Pulmonary effort is normal.     Breath sounds: Normal breath sounds.  Musculoskeletal:     Right ankle: Swelling present. Tenderness present over the lateral malleolus.     Left ankle: Normal.     Comments:  Swelling of the R ankle with tenderness over the lateral malleolus and warm to the touch. Distal sensation intact 2+ DP pulse.   Neurological:     Mental Status: He is alert.          ED Treatments / Results  Labs (all labs ordered are listed, but only abnormal results are displayed) Labs Reviewed - No data to display   EKG   Radiology Imaging Results (Last 48 hours)  No results found.     Procedures Procedures (including critical care time)   Medications Ordered in ED Medications - No data to display     Initial Impression / Assessment and Plan / ED Course  I have reviewed the triage vital signs and the nursing notes.   Pertinent labs & imaging results that were available during my care of the patient were reviewed by me and considered in my medical decision making (see chart for details).     POC glucose is 111   Will prescribe colchicine  1.2 mg PO once followed by 0.6 mg an hour later. Will also start prednisone  40 mg daily for 5 days. Pt cautioned to check his blood sugar regularly and understand the medication will cause it to increase temporarily, but it should come back down. Counseled pt on elevation and ice therapy. Encouraged pt to follow up with PCP. Red flag symptoms reviewed and return precautions given.        Final Clinical Impressions(s) / ED Diagnoses    Gout   New Prescriptions     Meds ordered this encounter  Medications   predniSONE  (DELTASONE ) 20 MG tablet    Sig: Take 2 tablets (40 mg total) by mouth daily.    Dispense:  10 tablet    Refill:  0   colchicine  0.6 MG tablet    Sig: Take two tablets as one dose followed one hour later by one tablet.    Dispense:  3 tablet    Refill:  0  Rolinda Rogue, MD 01/06/24 [phone]

## 2024-01-06 NOTE — ED Triage Notes (Addendum)
 Right ankle pain that started Monday.  Patient has a history of gout.  No known injury.  Right ankle is painful, swollen and stiff.    Tylenol  is medication he has taken for this issue

## 2024-01-06 NOTE — Telephone Encounter (Signed)
 Called [facility], spoke with Alondra, reports will contact pt for appt today.

## 2024-01-06 NOTE — Medical Student Note (Signed)
 [facility] Insurance account manager Note For educational purposes for Medical, PA and NP students only and not part of the legal medical record.   CSN: [id number] Arrival date & time: 01/06/24  1246      History   Chief Complaint Chief Complaint  Patient presents with   Ankle Pain    HPI Dillon Wolfe is a 53 y.o. male.  Pt with a hx of DM2, CKD, and gout presents with 2 days of R ankle pain and swelling. He reports he turned his R ankle wrong while he was at work on Sunday and the pain started the next day. He presents on crutches and has difficulty breathing weight due to the pain. Pt believes that he is having a gout flare up due to the injury and he states the pain is similar to previous gout flares. Her has been taking APAP without relief. He reports he stopped taking allopurinol  because it does not help him. He also reports he has finished his metformin  and believes he has improved his blood glucose.   The history is provided by the patient.  Ankle Pain Associated symptoms: no fever     Past Medical History:  Diagnosis Date   Diabetes mellitus without complication (HCC)    Gout     Patient Active Problem List   Diagnosis Date Noted   Type 2 diabetes mellitus with other specified complication (HCC) 01/17/2020   CKD stage 3 due to type 2 diabetes mellitus (HCC) 01/17/2020   Liver abscess 12/21/2019    Past Surgical History:  Procedure Laterality Date   IR RADIOLOGIST EVAL & MGMT  01/17/2020       Home Medications    Prior to Admission medications   Medication Sig Start Date End Date Taking? Authorizing Provider  allopurinol  (ZYLOPRIM ) 100 MG tablet TAKE 1 TABLET BY MOUTH EVERY DAY 11/13/23   Tanda Bleacher, MD  amLODipine  (NORVASC ) 2.5 MG tablet Take 1 tablet (2.5 mg total) by mouth daily. 10/29/23   [name], Amy E, NP  atorvastatin  (LIPITOR) 10 MG tablet Take 1 tablet (10 mg total) by mouth daily. 10/13/23   Mayers, Cari S, PA-C  metFORMIN   (GLUCOPHAGE ) 1000 MG tablet Take 1 tablet (1,000 mg total) by mouth 2 (two) times daily with a meal. Patient not taking: Reported on 01/06/2024 10/06/23 12/05/23  Mayers, Cari S, PA-C  Vitamin D , Ergocalciferol , (DRISDOL ) 1.25 MG (50000 UNIT) CAPS capsule Take 1 capsule (50,000 Units total) by mouth every 7 (seven) days. 10/13/23   Mayers, Kirk RAMAN, PA-C    Family History Family History  Problem Relation Age of Onset   Obesity Mother    Heart failure Mother    Hypertension Mother    Cancer Father     Social History Social History   Tobacco Use   Smoking status: Light Smoker    Types: Cigars   Smokeless tobacco: Never   Tobacco comments:    black and milds when drinking  Vaping Use   Vaping status: Never Used  Substance Use Topics   Alcohol use: Yes    Comment: Socially   Drug use: No     Allergies   Ivp dye [iodinated contrast media]   Review of Systems Review of Systems  Constitutional:  Negative for fever.  Musculoskeletal:  Positive for arthralgias and joint swelling.  Neurological:  Negative for numbness.     Physical Exam Updated Vital Signs BP (!) 140/96 (BP Location: Right Arm) Comment (BP Location): large cuff  Pulse  82   Temp 97.7 F (36.5 C) (Oral)   Resp 20   SpO2 98%   Physical Exam Constitutional:      Appearance: Normal appearance.  Cardiovascular:     Rate and Rhythm: Normal rate and regular rhythm.     Pulses: Normal pulses.     Heart sounds: Normal heart sounds.  Pulmonary:     Effort: Pulmonary effort is normal.     Breath sounds: Normal breath sounds.  Musculoskeletal:     Right ankle: Swelling present. Tenderness present over the lateral malleolus.     Left ankle: Normal.     Comments: Swelling of the R ankle with tenderness over the lateral malleolus and warm to the touch. Distal sensation intact 2+ DP pulse.   Neurological:     Mental Status: He is alert.      ED Treatments / Results  Labs (all labs ordered are listed, but only  abnormal results are displayed) Labs Reviewed - No data to display  EKG  Radiology No results found.  Procedures Procedures (including critical care time)  Medications Ordered in ED Medications - No data to display   Initial Impression / Assessment and Plan / ED Course  I have reviewed the triage vital signs and the nursing notes.  Pertinent labs & imaging results that were available during my care of the patient were reviewed by me and considered in my medical decision making (see chart for details).     POC glucose is 111  Will prescribe colchicine  1.2 mg PO once followed by 0.6 mg an hour later. Will also start prednisone  40 mg daily for 5 days. Pt cautioned to check his blood sugar regularly and understand the medication will cause it to increase temporarily, but it should come back down. Counseled pt on elevation and ice therapy. Encouraged pt to follow up with PCP. Red flag symptoms reviewed and return precautions given.     Final Clinical Impressions(s) / ED Diagnoses   Final diagnoses:  None    New Prescriptions New Prescriptions   No medications on file

## 2024-01-06 NOTE — Telephone Encounter (Signed)
 FYI Only or Action Required?: Action required by provider: request for appointment and clinical question for provider.  Patient was last seen in primary care on 10/29/2023 by Gil Greig BRAVO, NP.  Called Nurse Triage reporting Gout.  Symptoms began several days ago.  Interventions attempted: OTC medications: tylenol .  Symptoms are: gradually worsening.  Triage Disposition: See Physician Within 24 Hours  Patient/caregiver understands and will follow disposition?: Yes  Copied from CRM #[id number]. Topic: Clinical - Red Word Triage >> Jan 06, 2024 12:34 PM Winona R wrote: Gout Flare up yesterday- Pain and swelling Reason for Disposition  [1] Very swollen joint AND [2] no fever  Answer Assessment - Initial Assessment Questions No available appts today. Advised UC today. Patient reports will go to UC. Patient requesting appt today/ CALL BACK.  1. ONSET: When did the pain start?      Sunday 2. LOCATION: Where is the pain located?      Right ankle 3. PAIN: How bad is the pain?  (Scale 1-10; or mild, moderate, severe)     9/10 4. WORK OR EXERCISE: Has there been any recent work or exercise that involved this part of the body?      no 5. CAUSE: What do you think is causing the ankle pain?     Gout flare up 6. OTHER SYMPTOMS: Do you have any other symptoms? (e.g., calf pain, rash, fever, swelling)     Denies fever, chills,redness Took Tylenol , right foot and ankle, swollen;  Protocols used: Ankle Pain-A-AH

## 2024-01-11 ENCOUNTER — Other Ambulatory Visit: Payer: Self-pay | Admitting: Physician Assistant

## 2024-01-11 DIAGNOSIS — E559 Vitamin D deficiency, unspecified: Secondary | ICD-10-CM

## 2024-01-14 ENCOUNTER — Ambulatory Visit: Payer: Self-pay

## 2024-01-14 ENCOUNTER — Other Ambulatory Visit: Payer: Self-pay | Admitting: Orthopedic Surgery

## 2024-01-14 DIAGNOSIS — E1165 Type 2 diabetes mellitus with hyperglycemia: Secondary | ICD-10-CM

## 2024-01-14 MED ORDER — COLCHICINE 0.6 MG PO TABS: ORAL_TABLET | ORAL | 0 refills | Status: DC

## 2024-01-14 MED ORDER — PREDNISONE 20 MG PO TABS: 40.0000 mg | ORAL_TABLET | Freq: Every day | ORAL | 0 refills | Status: DC

## 2024-01-14 MED ORDER — METFORMIN HCL 1000 MG PO TABS: 1000.0000 mg | ORAL_TABLET | Freq: Two times a day (BID) | ORAL | 0 refills | Status: DC

## 2024-01-14 NOTE — Telephone Encounter (Signed)
 Request received for colchicine , prednisone  and metformin . Will need to confirm with pt if he is still taking metformin .  Triage needed as pt was prescribed colchicine  and prednisone  at [facility].

## 2024-01-14 NOTE — Telephone Encounter (Signed)
 Patient called in stating he is in need of refills. Patient is still taking the metformin . The  prednisone , and colchicine  were prescribed by UC for gout, however he wishes to continue on the medication once the current rx. Runs out.

## 2024-01-14 NOTE — Telephone Encounter (Signed)
 Called and spoke with [place] at the pharmacy, [facility],  and refill given over phone.

## 2024-01-14 NOTE — Telephone Encounter (Unsigned)
 Copied from CRM [id number]. Topic: Clinical - Medication Refill >> Jan 14, 2024 12:05 PM Cherylann S wrote: Medication: metFORMIN  (GLUCOPHAGE ) 1000 MG tablet  predniSONE  (DELTASONE ) 20 MG tablet  colchicine  0.6 MG tablet  Has the patient contacted their pharmacy? No (Agent: If no, request that the patient contact the pharmacy for the refill. If patient does not wish to contact the pharmacy document the reason why and proceed with request.) (Agent: If yes, when and what did the pharmacy advise?)  This is the patient's preferred pharmacy:  [id number] [address] - [address] [place] [address] Phone: [phone] Fax: [phone]  Is this the correct pharmacy for this prescription? Yes If no, delete pharmacy and type the correct one.   Has the prescription been filled recently? No  Is the patient out of the medication? Yes  Has the patient been seen for an appointment in the last year OR does the patient have an upcoming appointment? Yes  Can we respond through MyChart? Yes  Agent: Please be advised that Rx refills may take up to 3 business days. We ask that you follow-up with your pharmacy.

## 2024-01-14 NOTE — Telephone Encounter (Signed)
 Please advise on request for   1.) Prednisone  2.) Colchicine   RX for metformin  will not transmit electronically, Donzell please fax printed copy (should be on printer beside Darice or in clinical area)

## 2024-01-14 NOTE — Telephone Encounter (Signed)
 Created NT encounter to confirm if taking metformin  and to clarify request for colchicine  and prednisone  (appears was rx at [facility]). Left a voicemail, will make 2 additional attempts.

## 2024-01-14 NOTE — Telephone Encounter (Signed)
 Had to read patients encounter regarding medication , was trying to refill medication and refreshed and it was already approved by [employer]. Medication sent to authorizing provider for review.

## 2024-01-17 ENCOUNTER — Other Ambulatory Visit: Payer: Self-pay | Admitting: Physician Assistant

## 2024-01-17 DIAGNOSIS — Z9189 Other specified personal risk factors, not elsewhere classified: Secondary | ICD-10-CM

## 2024-01-28 ENCOUNTER — Encounter: Admitting: Orthopedic Surgery

## 2024-01-29 NOTE — Progress Notes (Signed)
 This encounter was created in error - please disregard.

## 2024-02-05 ENCOUNTER — Ambulatory Visit: Payer: Self-pay

## 2024-02-05 ENCOUNTER — Other Ambulatory Visit: Payer: Self-pay | Admitting: Nurse Practitioner

## 2024-02-05 ENCOUNTER — Encounter (HOSPITAL_COMMUNITY): Payer: Self-pay

## 2024-02-05 ENCOUNTER — Ambulatory Visit (HOSPITAL_COMMUNITY)
Admission: EM | Admit: 2024-02-05 | Discharge: 2024-02-05 | Disposition: A | Discharge status: Home / Self Care | Attending: Family Medicine | Admitting: Family Medicine

## 2024-02-05 DIAGNOSIS — M109 Gout, unspecified: Secondary | ICD-10-CM

## 2024-02-05 DIAGNOSIS — E1169 Type 2 diabetes mellitus with other specified complication: Secondary | ICD-10-CM

## 2024-02-05 DIAGNOSIS — Z8739 Personal history of other diseases of the musculoskeletal system and connective tissue: Secondary | ICD-10-CM

## 2024-02-05 LAB — POCT FASTING CBG KUC MANUAL ENTRY: POCT Glucose (KUC): 130 mg/dL — AB (ref 70–99)

## 2024-02-05 MED ORDER — COLCHICINE 0.6 MG PO TABS: ORAL_TABLET | ORAL | 0 refills | Status: AC

## 2024-02-05 MED ORDER — ALLOPURINOL 100 MG PO TABS: 100.0000 mg | ORAL_TABLET | Freq: Every day | ORAL | 0 refills | Status: AC

## 2024-02-05 MED ORDER — PREDNISONE 20 MG PO TABS: 40.0000 mg | ORAL_TABLET | Freq: Every day | ORAL | 0 refills | Status: DC

## 2024-02-05 NOTE — Telephone Encounter (Signed)
 Contacted patient regarding [name], Dillon E, NP response patient verbalized understanding and had no other questions.

## 2024-02-05 NOTE — Discharge Instructions (Addendum)
 You were seen today for a gout flare. I have sent out prednisone  and colchicine  to take for this flare.  Once your symptoms are improved, please start the allopurinol  daily for gout prevention.  Follow up with your primary care provider for further discussion as needed.

## 2024-02-05 NOTE — Telephone Encounter (Signed)
 Leg swelling can be caused by amlodipine , but it is typically seen with higher doses like 7.5 mg or 10 mg. I do not think it is the medication. Recommend limiting salt in diet, that can increased swelling. I also see you are having right ankle pain. Decreased walking and injury can also cause swelling.

## 2024-02-05 NOTE — Telephone Encounter (Signed)
 Scheduled pt an appointment on 11/20 with pcp Patient also stated that he believes the amlodipine  is what is calling the swelling and he wants to know what should he do about that in the meantime.

## 2024-02-05 NOTE — ED Provider Notes (Signed)
 Dillon Wolfe    CSN: [id number] Arrival date & time: 02/05/24  1040      History   Chief Complaint Chief Complaint  Patient presents with   Gout    HPI Dillon Wolfe is a 53 y.o. male.   Patient is here for a gout flare to the right ankle.  This started yesterday.  He states he has not been drinking/eating anything that should make it happen  and now believes it may be due to the amlodipine .  He started this several months ago.  Upon review of his med list, he states he does not have the allopurinol  at home.        Past Medical History:  Diagnosis Date   Diabetes mellitus without complication (HCC)    Gout     Patient Active Problem List   Diagnosis Date Noted   Type 2 diabetes mellitus with other specified complication (HCC) 01/17/2020   CKD stage 3 due to type 2 diabetes mellitus (HCC) 01/17/2020   Liver abscess 12/21/2019    Past Surgical History:  Procedure Laterality Date   IR RADIOLOGIST EVAL & MGMT  01/17/2020       Home Medications    Prior to Admission medications   Medication Sig Start Date End Date Taking? Authorizing Provider  allopurinol  (ZYLOPRIM ) 100 MG tablet TAKE 1 TABLET BY MOUTH EVERY DAY 11/13/23  Yes Tanda Bleacher, MD  amLODipine  (NORVASC ) 2.5 MG tablet Take 1 tablet (2.5 mg total) by mouth daily. 10/29/23  Yes [name], Amy E, NP  atorvastatin  (LIPITOR) 10 MG tablet Take 1 tablet (10 mg total) by mouth daily. 10/13/23  Yes Mayers, Cari S, PA-C  colchicine  0.6 MG tablet Take two tablets as one dose followed one hour later by one tablet. 01/14/24  Yes Mast, Man X, NP  metFORMIN  (GLUCOPHAGE ) 1000 MG tablet Take 1 tablet (1,000 mg total) by mouth 2 (two) times daily with a meal. 01/14/24 03/14/24 Yes [name], Amy E, NP  predniSONE  (DELTASONE ) 20 MG tablet Take 2 tablets (40 mg total) by mouth daily. 01/14/24  Yes Mast, Man X, NP  Vitamin D , Ergocalciferol , (DRISDOL ) 1.25 MG (50000 UNIT) CAPS capsule Take 1 capsule (50,000 Units total)  by mouth every 7 (seven) days. 10/13/23  Yes Mayers, Cari S, PA-C    Family History Family History  Problem Relation Age of Onset   Obesity Mother    Heart failure Mother    Hypertension Mother    Cancer Father     Social History Social History   Tobacco Use   Smoking status: Light Smoker    Types: Cigars   Smokeless tobacco: Never   Tobacco comments:    black and milds when drinking  Vaping Use   Vaping status: Never Used  Substance Use Topics   Alcohol use: Yes    Comment: Socially   Drug use: No     Allergies   Ivp dye [iodinated contrast media]   Review of Systems Review of Systems  Constitutional: Negative.   HENT: Negative.    Respiratory: Negative.    Cardiovascular: Negative.   Gastrointestinal: Negative.   Musculoskeletal:  Positive for joint swelling.     Physical Exam Triage Vital Signs ED Triage Vitals  Encounter Vitals Group     BP 02/05/24 1131 (!) 135/91     Girls Systolic BP Percentile --      Girls Diastolic BP Percentile --      Boys Systolic BP Percentile --  Boys Diastolic BP Percentile --      Pulse Rate 02/05/24 1131 75     Resp 02/05/24 1131 18     Temp 02/05/24 1131 98.3 F (36.8 C)     Temp Source 02/05/24 1131 Oral     SpO2 02/05/24 1131 97 %     Weight --      Height 02/05/24 1131 5' 8 (1.727 m)     Head Circumference --      Peak Flow --      Pain Score 02/05/24 1130 5     Pain Loc --      Pain Education --      Exclude from Growth Chart --    No data found.  Updated Vital Signs BP (!) 135/91 (BP Location: Right Arm)   Pulse 75   Temp 98.3 F (36.8 C) (Oral)   Resp 18   Ht 5' 8 (1.727 m)   SpO2 97%   BMI 37.04 kg/m   Visual Acuity Right Eye Distance:   Left Eye Distance:   Bilateral Distance:    Right Eye Near:   Left Eye Near:    Bilateral Near:     Physical Exam Constitutional:      General: He is not in acute distress.    Appearance: Normal appearance. He is normal weight. He is not  ill-appearing or toxic-appearing.  Musculoskeletal:     Comments: Slight redness and swelling to the right lateral ankle;    Neurological:     Mental Status: He is alert.      UC Treatments / Results  Labs (all labs ordered are listed, but only abnormal results are displayed) Labs Reviewed  POCT FASTING CBG [facility] MANUAL ENTRY - Abnormal; Notable for the following components:      Result Value   POCT Glucose ([facility]) 130 (*)    All other components within normal limits    EKG   Radiology No results found.  Procedures Procedures (including critical care time)  Medications Ordered in UC Medications - No data to display  Initial Impression / Assessment and Plan / UC Course  I have reviewed the triage vital signs and the nursing notes.  Pertinent labs & imaging results that were available during my care of the patient were reviewed by me and considered in my medical decision making (see chart for details).   Final Clinical Impressions(s) / UC Diagnoses   Final diagnoses:  Type 2 diabetes mellitus with other specified complication, without long-term current use of insulin  (HCC)  Acute gout of right ankle, unspecified cause     Discharge Instructions      You were seen today for a gout flare. I have sent out prednisone  and colchicine  to take for this flare.  Once your symptoms are improved, please start the allopurinol  daily for gout prevention.  Follow up with your primary care provider for further discussion as needed.     ED Prescriptions     Medication Sig Dispense Auth. Provider   allopurinol  (ZYLOPRIM ) 100 MG tablet Take 1 tablet (100 mg total) by mouth daily. 90 tablet Konstance Happel, MD   colchicine  0.6 MG tablet Take two tablets as one dose followed one hour later by one tablet. 3 tablet Neri Samek, MD   predniSONE  (DELTASONE ) 20 MG tablet Take 2 tablets (40 mg total) by mouth daily. 10 tablet Darral Longs, MD      PDMP not reviewed this encounter.    Darral Longs, MD 02/05/24 [phone]

## 2024-02-05 NOTE — ED Triage Notes (Signed)
 Patient states he has a history of gout. States since recently starting Amlodipine  the gout flared up. Started the new med 10/29/23. Current gout flare ongoing but worse recently. Pain in the right ankle and swelling.

## 2024-02-05 NOTE — Telephone Encounter (Signed)
 FYI Only or Action Required?: FYI only for provider: went to UC due to pain and stiffness.  Patient was last seen in primary care on 10/29/2023 by Gil Greig BRAVO, NP.  Called Nurse Triage reporting Ankle Pain.  Symptoms began yesterday.  Interventions attempted: Rest, hydration, or home remedies.  Symptoms are: gradually worsening.  Triage Disposition: See PCP When Office is Open (Within 3 Days)  Patient/caregiver understands and will follow disposition?: Yes    Copied from CRM #[id number]. Topic: Clinical - Red Word Triage >> Feb 05, 2024 10:23 AM Carrielelia G wrote: [facility] that prompted transfer to Nurse Triage: gout attack, swelling ankles, pain, Reason for Disposition  [1] MODERATE pain (e.g., interferes with normal activities, limping) AND [2] present > 3 days  Answer Assessment - Initial Assessment Questions Pt has gout and has been monitoring his diet very strictly but continues to have swelling. He states that when he walks it makes the pain worse. He has been doing research and thinks that the amlodipine  is the problem and causing the swelling and gout flares. He states that he needs prednisone  to kick it. He states that he is currently sitting in the parking lot of UC. Due to schedule availability and pain, swelling, stiffness, RN did advise pt to go into UC to be evaluated.    1. ONSET: When did the pain start?      Last night 2. LOCATION: Where is the pain located?      ankles 3. PAIN: How bad is the pain?  (Scale 1-10; or mild, moderate, severe)     5 4. WORK OR EXERCISE: Has there been any recent work or exercise that involved this part of the body?      Walking makes it worse 5. CAUSE: What do you think is causing the ankle pain?     amlodipine  6. OTHER SYMPTOMS: Do you have any other symptoms? (e.g., calf pain, rash, fever, swelling)     Swelling, pain  Protocols used: Ankle Pain-A-AH

## 2024-02-25 ENCOUNTER — Encounter: Admitting: Orthopedic Surgery

## 2024-02-25 NOTE — Progress Notes (Signed)
 This encounter was created in error - please disregard.

## 2024-04-11 ENCOUNTER — Ambulatory Visit (HOSPITAL_COMMUNITY)
Admission: EM | Admit: 2024-04-11 | Discharge: 2024-04-11 | Disposition: A | Discharge status: Home / Self Care | Attending: Family Medicine | Admitting: Family Medicine

## 2024-04-11 ENCOUNTER — Encounter (HOSPITAL_COMMUNITY): Payer: Self-pay

## 2024-04-11 DIAGNOSIS — R051 Acute cough: Secondary | ICD-10-CM

## 2024-04-11 DIAGNOSIS — Z8739 Personal history of other diseases of the musculoskeletal system and connective tissue: Secondary | ICD-10-CM

## 2024-04-11 DIAGNOSIS — Z7984 Long term (current) use of oral hypoglycemic drugs: Secondary | ICD-10-CM | POA: Diagnosis not present

## 2024-04-11 DIAGNOSIS — E1165 Type 2 diabetes mellitus with hyperglycemia: Secondary | ICD-10-CM

## 2024-04-11 DIAGNOSIS — E119 Type 2 diabetes mellitus without complications: Secondary | ICD-10-CM | POA: Diagnosis not present

## 2024-04-11 LAB — POC SOFIA SARS ANTIGEN FIA: SARS Coronavirus 2 Ag: NEGATIVE

## 2024-04-11 MED ORDER — METFORMIN HCL 1000 MG PO TABS: 1000.0000 mg | ORAL_TABLET | Freq: Two times a day (BID) | ORAL | 0 refills | Status: AC

## 2024-04-11 MED ORDER — BENZONATATE 100 MG PO CAPS: 100.0000 mg | ORAL_CAPSULE | Freq: Three times a day (TID) | ORAL | 0 refills | Status: AC | PRN

## 2024-04-11 NOTE — ED Provider Notes (Signed)
 " [facility]    CSN: [id number] Arrival date & time: 04/11/24  1438      History   Chief Complaint Chief Complaint  Patient presents with   Letter for School/Work   Medication Refill    HPI Zariah Jost is a 54 y.o. male.    Medication Refill  Here for cough.  Started coughing some and it is only when he gets warm.  It started on January 2. No fever or chills  No nasal drainage.  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  He is allergic to IVP dye  He requests refill for his metformin  as he is out.  He states he has some of the other medications he takes for blood pressure and gout.  He is overdue to visit with his primary care   Past Medical History:  Diagnosis Date   Diabetes mellitus without complication (HCC)    Gout     Patient Active Problem List   Diagnosis Date Noted   Type 2 diabetes mellitus with other specified complication (HCC) 01/17/2020   CKD stage 3 due to type 2 diabetes mellitus (HCC) 01/17/2020   Liver abscess 12/21/2019    Past Surgical History:  Procedure Laterality Date   IR RADIOLOGIST EVAL & MGMT  01/17/2020       Home Medications    Prior to Admission medications  Medication Sig Start Date End Date Taking? Authorizing Provider  benzonatate  (TESSALON ) 100 MG capsule Take 1 capsule (100 mg total) by mouth 3 (three) times daily as needed for cough. 04/11/24  Yes Vonna Sharlet POUR, MD  allopurinol  (ZYLOPRIM ) 100 MG tablet Take 1 tablet (100 mg total) by mouth daily. 02/05/24   Piontek, Rocky, MD  amLODipine  (NORVASC ) 2.5 MG tablet Take 1 tablet (2.5 mg total) by mouth daily. 10/29/23   [name], Amy E, NP  atorvastatin  (LIPITOR) 10 MG tablet Take 1 tablet (10 mg total) by mouth daily. 10/13/23   Mayers, Cari S, PA-C  colchicine  0.6 MG tablet Take two tablets as one dose followed one hour later by one tablet. 02/05/24    Piontek, Rocky, MD  metFORMIN  (GLUCOPHAGE ) 1000 MG tablet Take 1 tablet (1,000 mg total) by mouth 2 (two) times daily with a meal. 04/11/24 06/10/24  Herminio Kniskern, Sharlet POUR, MD  Vitamin D , Ergocalciferol , (DRISDOL ) 1.25 MG (50000 UNIT) CAPS capsule Take 1 capsule (50,000 Units total) by mouth every 7 (seven) days. 10/13/23   Mayers, Kirk RAMAN, PA-C    Family History Family History  Problem Relation Age of Onset   Obesity Mother    Heart failure Mother    Hypertension Mother    Cancer Father     Social History Social History[1]   Allergies   Ivp dye [iodinated contrast media]   Review of Systems Review of Systems   Physical Exam Triage Vital Signs ED Triage Vitals  Encounter Vitals Group     BP 04/11/24 1745 (!) 128/95     Girls Systolic BP Percentile --      Girls Diastolic BP Percentile --      Boys Systolic BP Percentile --      Boys Diastolic BP Percentile --      Pulse Rate 04/11/24 1745 91     Resp 04/11/24 1745 16     Temp 04/11/24 1745 99 F (37.2 C)     Temp Source 04/11/24 1745  Oral     SpO2 04/11/24 1745 97 %     Weight --      Height --      Head Circumference --      Peak Flow --      Pain Score 04/11/24 1752 0     Pain Loc --      Pain Education --      Exclude from Growth Chart --    No data found.  Updated Vital Signs BP (!) 128/95 (BP Location: Right Arm)   Pulse 91   Temp 99 F (37.2 C) (Oral)   Resp 16   SpO2 97%   Visual Acuity Right Eye Distance:   Left Eye Distance:   Bilateral Distance:    Right Eye Near:   Left Eye Near:    Bilateral Near:     Physical Exam Vitals reviewed.  Constitutional:      General: He is not in acute distress.    Appearance: He is not toxic-appearing.  HENT:     Nose: Nose normal.     Mouth/Throat:     Mouth: Mucous membranes are moist.     Pharynx: No oropharyngeal exudate or posterior oropharyngeal erythema.  Eyes:     Extraocular Movements: Extraocular movements intact.     Conjunctiva/sclera:  Conjunctivae normal.     Pupils: Pupils are equal, round, and reactive to light.  Cardiovascular:     Rate and Rhythm: Normal rate and regular rhythm.     Heart sounds: No murmur heard. Pulmonary:     Effort: Pulmonary effort is normal. No respiratory distress.     Breath sounds: Normal breath sounds. No stridor. No wheezing, rhonchi or rales.  Musculoskeletal:     Cervical back: Neck supple.  Lymphadenopathy:     Cervical: No cervical adenopathy.  Skin:    Capillary Refill: Capillary refill takes less than 2 seconds.     Coloration: Skin is not jaundiced or pale.  Neurological:     General: No focal deficit present.     Mental Status: He is alert and oriented to person, place, and time.  Psychiatric:        Behavior: Behavior normal.      UC Treatments / Results  Labs (all labs ordered are listed, but only abnormal results are displayed) Labs Reviewed  POC SOFIA SARS ANTIGEN FIA    EKG   Radiology No results found.  Procedures Procedures (including critical care time)  Medications Ordered in UC Medications - No data to display  Initial Impression / Assessment and Plan / UC Course  I have reviewed the triage vital signs and the nursing notes.  Pertinent labs & imaging results that were available during my care of the patient were reviewed by me and considered in my medical decision making (see chart for details).     COVID test is negative Tessalon  Perles are sent in for cough Is metformin  is sent in I have asked him to follow-up with his primary care  Final Clinical Impressions(s) / UC Diagnoses   Final diagnoses:  Acute cough  Type 2 diabetes mellitus without complication, without long-term current use of insulin  [facility])     Discharge Instructions      COVID test was negative  Take benzonatate  100 mg, 1 tab every 8 hours as needed for cough.  Continue your metformin  1000 mg 2 times daily  Please make a follow-up appointment with your primary  care     ED Prescriptions  Medication Sig Dispense Auth. Provider   metFORMIN  (GLUCOPHAGE ) 1000 MG tablet Take 1 tablet (1,000 mg total) by mouth 2 (two) times daily with a meal. 60 tablet Ikenna Ohms, Sharlet POUR, MD   benzonatate  (TESSALON ) 100 MG capsule Take 1 capsule (100 mg total) by mouth 3 (three) times daily as needed for cough. 21 capsule Rishith Siddoway K, MD      PDMP not reviewed this encounter.    [1]  Social History Tobacco Use   Smoking status: Light Smoker    Types: Cigars   Smokeless tobacco: Never   Tobacco comments:    black and milds when drinking  Vaping Use   Vaping status: Never Used  Substance Use Topics   Alcohol use: Yes    Comment: Socially   Drug use: No     Vonna Sharlet POUR, MD 04/11/24 1841  "

## 2024-04-11 NOTE — ED Triage Notes (Signed)
 Patient here today requesting a work note. Patient states that he was exposed to Covid at work and went into work coughing a little and they are requesting a note. Patient feels okay otherwise.   Patient also requested that some of his medications be refilled.

## 2024-04-11 NOTE — Discharge Instructions (Signed)
 COVID test was negative  Take benzonatate  100 mg, 1 tab every 8 hours as needed for cough.  Continue your metformin  1000 mg 2 times daily  Please make a follow-up appointment with your primary care
